# Patient Record
Sex: Male | Born: 1960 | Race: Black or African American | Hispanic: No | Marital: Single | Smoking: Former smoker
Health system: Southern US, Community
[De-identification: ages and names within clinical notes are randomized; demographics above are authoritative.]

## PROBLEM LIST (undated history)

## (undated) DIAGNOSIS — I2699 Other pulmonary embolism without acute cor pulmonale: Secondary | ICD-10-CM

## (undated) DIAGNOSIS — IMO0001 Reserved for inherently not codable concepts without codable children: Secondary | ICD-10-CM

## (undated) HISTORY — PX: BILATERAL CARPAL TUNNEL RELEASE: SHX6508

---

## 2008-02-08 ENCOUNTER — Ambulatory Visit: Payer: Self-pay | Admitting: Family Medicine

## 2008-06-24 ENCOUNTER — Ambulatory Visit: Payer: Self-pay | Admitting: Family Medicine

## 2011-03-22 ENCOUNTER — Ambulatory Visit: Payer: Self-pay | Admitting: Family Medicine

## 2011-03-23 ENCOUNTER — Ambulatory Visit: Payer: Self-pay | Admitting: Internal Medicine

## 2011-03-24 ENCOUNTER — Ambulatory Visit: Payer: Self-pay | Admitting: Internal Medicine

## 2011-03-25 ENCOUNTER — Ambulatory Visit: Payer: Self-pay | Admitting: Family Medicine

## 2011-03-27 ENCOUNTER — Ambulatory Visit: Payer: Self-pay | Admitting: Internal Medicine

## 2012-04-04 LAB — CBC
HCT: 48.2 % (ref 40.0–52.0)
HGB: 15.6 g/dL (ref 13.0–18.0)
MCH: 31.2 pg (ref 26.0–34.0)
MCHC: 32.3 g/dL (ref 32.0–36.0)
MCV: 97 fL (ref 80–100)
Platelet: 250 10*3/uL (ref 150–440)
RDW: 12.8 % (ref 11.5–14.5)
WBC: 7.6 10*3/uL (ref 3.8–10.6)

## 2012-04-04 LAB — COMPREHENSIVE METABOLIC PANEL
Albumin: 4.1 g/dL (ref 3.4–5.0)
Alkaline Phosphatase: 116 U/L (ref 50–136)
Anion Gap: 8 (ref 7–16)
BUN: 19 mg/dL — ABNORMAL HIGH (ref 7–18)
Chloride: 106 mmol/L (ref 98–107)
Co2: 25 mmol/L (ref 21–32)
EGFR (African American): >60
EGFR (Non-African Amer.): >60
Glucose: 101 mg/dL — ABNORMAL HIGH (ref 65–99)
Osmolality: 280 (ref 275–301)
SGOT(AST): 25 U/L (ref 15–37)
SGPT (ALT): 44 U/L
Sodium: 139 mmol/L (ref 136–145)
Total Protein: 8.4 g/dL — ABNORMAL HIGH (ref 6.4–8.2)

## 2012-04-04 LAB — TROPONIN I: Troponin-I: <0.02 ng/mL

## 2012-04-04 LAB — CK TOTAL AND CKMB (NOT AT ARMC): CK-MB: <0.5 ng/mL — ABNORMAL LOW (ref 0.5–3.6)

## 2012-04-05 ENCOUNTER — Inpatient Hospital Stay: Payer: Self-pay | Admitting: Internal Medicine

## 2012-04-05 LAB — PROTIME-INR
INR: 1
Prothrombin Time: 13.8 secs (ref 11.5–14.7)

## 2012-04-05 LAB — URINALYSIS, COMPLETE
Bacteria: NONE SEEN
Blood: NEGATIVE
Leukocyte Esterase: NEGATIVE
Nitrite: NEGATIVE
Ph: 5 (ref 4.5–8.0)
Protein: 100
RBC,UR: 1 /HPF (ref 0–5)
Specific Gravity: 1.048 (ref 1.003–1.030)

## 2012-04-05 LAB — DIFFERENTIAL
Basophil %: 0.6 %
Eosinophil #: 0.2 10*3/uL (ref 0.0–0.7)
Eosinophil %: 2.4 %
Lymphocyte #: 2.2 10*3/uL (ref 1.0–3.6)
Monocyte #: 0.7 x10 3/mm (ref 0.2–1.0)
Neutrophil #: 4.5 10*3/uL (ref 1.4–6.5)
Neutrophil %: 59 %

## 2012-04-06 LAB — HEMOGLOBIN: HGB: 16.3 g/dL (ref 13.0–18.0)

## 2012-04-06 LAB — PROTIME-INR
INR: 1
Prothrombin Time: 13.3 secs (ref 11.5–14.7)

## 2012-04-07 LAB — PROTIME-INR: INR: 0.9

## 2012-04-07 LAB — APTT: Activated PTT: 77.7 secs — ABNORMAL HIGH (ref 23.6–35.9)

## 2012-04-08 LAB — PROTIME-INR
INR: 0.9
Prothrombin Time: 12.8 secs (ref 11.5–14.7)

## 2012-04-08 LAB — APTT
Activated PTT: 46.2 secs — ABNORMAL HIGH (ref 23.6–35.9)
Activated PTT: 76.2 secs — ABNORMAL HIGH (ref 23.6–35.9)

## 2012-04-09 DIAGNOSIS — I517 Cardiomegaly: Secondary | ICD-10-CM

## 2012-04-09 LAB — CBC WITH DIFFERENTIAL/PLATELET
Eosinophil #: 0.2 10*3/uL (ref 0.0–0.7)
Eosinophil %: 3.3 %
Lymphocyte %: 34.5 %
MCHC: 35.5 g/dL (ref 32.0–36.0)
MCV: 96 fL (ref 80–100)
Monocyte %: 4.7 %
Neutrophil %: 56.5 %
Platelet: 279 10*3/uL (ref 150–440)
RBC: 4.88 10*6/uL (ref 4.40–5.90)
RDW: 12.9 % (ref 11.5–14.5)
WBC: 5.6 10*3/uL (ref 3.8–10.6)

## 2012-04-09 LAB — LIPID PANEL
Ldl Cholesterol, Calc: 79 mg/dL (ref 0–100)
VLDL Cholesterol, Calc: 72 mg/dL — ABNORMAL HIGH (ref 5–40)

## 2012-04-09 LAB — CK: CK, Total: 142 U/L (ref 35–232)

## 2012-04-09 LAB — HEPATIC FUNCTION PANEL A (ARMC)
Albumin: 3.3 g/dL — ABNORMAL LOW (ref 3.4–5.0)
SGOT(AST): 81 U/L — ABNORMAL HIGH (ref 15–37)
SGPT (ALT): 128 U/L — ABNORMAL HIGH
Total Protein: 8.1 g/dL (ref 6.4–8.2)

## 2012-04-09 LAB — APTT: Activated PTT: 113.3 secs — ABNORMAL HIGH (ref 23.6–35.9)

## 2012-04-09 LAB — PROTIME-INR
INR: 1.2
Prothrombin Time: 16 secs — ABNORMAL HIGH (ref 11.5–14.7)

## 2012-04-09 LAB — TSH: Thyroid Stimulating Horm: 2.66 u[IU]/mL

## 2012-04-09 LAB — CREATININE, SERUM: Creatinine: 1.14 mg/dL (ref 0.60–1.30)

## 2012-04-10 ENCOUNTER — Ambulatory Visit: Payer: Self-pay | Admitting: Internal Medicine

## 2012-04-10 LAB — HEPATIC FUNCTION PANEL A (ARMC)
Alkaline Phosphatase: 119 U/L (ref 50–136)
Bilirubin,Total: 0.3 mg/dL (ref 0.2–1.0)
SGOT(AST): 72 U/L — ABNORMAL HIGH (ref 15–37)
SGPT (ALT): 136 U/L — ABNORMAL HIGH
Total Protein: 7.7 g/dL (ref 6.4–8.2)

## 2012-04-14 ENCOUNTER — Ambulatory Visit: Payer: Self-pay | Admitting: Internal Medicine

## 2012-04-14 LAB — CEA: CEA: 0.6 ng/mL (ref 0.0–4.7)

## 2012-05-13 ENCOUNTER — Ambulatory Visit: Payer: Self-pay | Admitting: Internal Medicine

## 2012-05-15 ENCOUNTER — Ambulatory Visit: Payer: Self-pay | Admitting: Internal Medicine

## 2012-08-22 ENCOUNTER — Ambulatory Visit: Payer: Self-pay | Admitting: Internal Medicine

## 2012-09-29 ENCOUNTER — Ambulatory Visit: Payer: Self-pay | Admitting: Orthopedic Surgery

## 2012-09-29 LAB — CBC WITH DIFFERENTIAL/PLATELET
HCT: 44.8 % (ref 40.0–52.0)
HGB: 15.8 g/dL (ref 13.0–18.0)
MCH: 33.6 pg (ref 26.0–34.0)
MCHC: 35.2 g/dL (ref 32.0–36.0)
MCV: 95 fL (ref 80–100)
Monocyte #: 0.3 x10 3/mm (ref 0.2–1.0)
Monocyte %: 8.4 %
Neutrophil #: 1.7 10*3/uL (ref 1.4–6.5)
Platelet: 273 10*3/uL (ref 150–440)
RBC: 4.69 10*6/uL (ref 4.40–5.90)
RDW: 12.8 % (ref 11.5–14.5)

## 2012-09-29 LAB — BASIC METABOLIC PANEL
BUN: 14 mg/dL (ref 7–18)
Calcium, Total: 8.7 mg/dL (ref 8.5–10.1)
Co2: 25 mmol/L (ref 21–32)
Creatinine: 1.04 mg/dL (ref 0.60–1.30)
EGFR (Non-African Amer.): >60
Glucose: 89 mg/dL (ref 65–99)
Osmolality: 277 (ref 275–301)
Sodium: 139 mmol/L (ref 136–145)

## 2012-10-13 ENCOUNTER — Ambulatory Visit: Payer: Self-pay | Admitting: Orthopedic Surgery

## 2012-11-25 LAB — LIPID PANEL
Cholesterol: 199 mg/dL (ref 0–200)
HDL: 31 mg/dL — AB (ref 35–70)
LDL Cholesterol: 124 mg/dL
Triglycerides: 220 mg/dL — AB (ref 40–160)

## 2012-11-25 LAB — BASIC METABOLIC PANEL
BUN: 13 mg/dL (ref 4–21)
Creatinine: 1 mg/dL (ref <=1.3)

## 2013-02-21 ENCOUNTER — Ambulatory Visit: Payer: Self-pay | Admitting: Internal Medicine

## 2013-11-15 ENCOUNTER — Emergency Department: Payer: Self-pay | Admitting: Emergency Medicine

## 2014-02-19 ENCOUNTER — Emergency Department: Payer: Self-pay | Admitting: Emergency Medicine

## 2015-01-01 NOTE — Consult Note (Signed)
Admit Reason:   Pulmonary embolus: (415.19) Active, ICD9, Other pulmonary embolism and infarction    Present Illness CC: chest pain and dyspnea HPI: 54 y.o male with no significant PMHx presents with dyspnea and pleuritic chest pain x 2 days associated with left back pain, left arm pain, productive cough. In Ed found to be mildly hypoxic (91%); CT w contrast showed large saddle PE (rt and left). 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 Denied hemoptysis, known fam hx of bleeding/clotting disorders    No Known Allergies:   Case History and Physical Exam:   Chief Complaint Shortness of Breath    Past Surgical History None    Family History Coronary Artery Disease  Diabetes Mellitus  Cancer    HEENT PERLA    Neck/Nodes Supple  No Adenopathy    Chest/Lungs Wheeze, dyspnea,    Cardiovascular No Murmurs or Gallops    Abdomen Benign  Active bowel sounds  x 4 quadrants    Musculoskeletal Full range of motion    Neurological Grossly WNL    Skin Warm   Nursing/Ancillary Notes: **Vital Signs.:   27-Jul-13 11:25   Vital Signs Type Routine   Temperature Temperature (F) 98.2   Celsius 36.7   Temperature Source Oral   Pulse Pulse 100   Respirations Respirations 18   Systolic BP Systolic BP 794   Diastolic BP (mmHg) Diastolic BP (mmHg) 79   Mean BP 93   Pulse Ox % Pulse Ox % 94   Pulse Ox Activity Level  At rest   Oxygen Delivery Room Air/ 21 %   Telemetry pattern Cardiac Rhythm Normal sinus rhythm     Impression 1. Saddle PE: tele monitor, cont heparin drip,  dc coumadin. Start xarelto will try to get approval  2.  left leg DVT: Pt will start Xarelto 15mg  for first 21 days then transition to 20mg  for 5 more months.      Discussed with patient to start wearing Compression Stockings with work at least at the 15-36mmHG level. Patient knows to not wear them while sleeping. Recommend patient follow up as outpatient with our office in 4-6 weeks with  ultrasound and evaluation.    Plan Level 3   Electronic Signatures: Lane Hacker (PA-C)  (Signed 27-Jul-13 12:21)  Authored: Health Issues, General Aspect/Present Illness, Allergies, History and Physical Exam, Vital Signs, Impression/Plan   Last Updated: 27-Jul-13 12:21 by Lane Hacker (PA-C)

## 2015-01-01 NOTE — H&P (Signed)
PATIENT NAME:  Johnathan Madden, Johnathan Madden MR#:  [id number] DATE OF BIRTH:  1960/09/24  DATE OF ADMISSION:  04/05/2012  CHIEF COMPLAINT: Shortness of breath.   HISTORY OF PRESENT ILLNESS: The patient is a 53 year old male with no chronic medical conditions who presents with two day duration of shortness of breath associated with pleuritic chest pain. He notes that two days prior to presentation he developed shortness of breath and left pleuritic chest pain. He initially ignored it, however, on the evening of presentation pain worsened to 10 out of 10 with associated left shoulder blade pain, increased shortness of breath, and pain going down his left arm. He decided to present to the Emergency Room. He notes that he's had a dry cough with occasional production, however, denies hemoptysis. He notes that his symptoms were exacerbated by exertion. Upon evaluation in the Emergency Room, CT angiogram demonstrated saddle embolus and we were called for admission. 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 He drives approximately 8 hours daily 5 days a week.   PAST MEDICAL HISTORY: Denies.  PAST SURGICAL HISTORY: Denies.  MEDICATIONS: Aleve 2 to 4 tabs daily as needed.   ALLERGIES: Denies.   FAMILY HISTORY: Notable for deceased mother at age 38 with cancer. The patient presumes it was melanoma on her scalp. Father is alive, history of coronary artery disease and diabetes. First myocardial infarction was at age 30. There is no family history of blood clots as far as the patient is aware. However, he does note that a paternal aunt has been restricted from flying due to possibility of blood clot but he is not sure. He denies any family history of excessive bleeding. Denies history of strokes in the family.   SOCIAL HISTORY: 30-pack-year history. Drinks 1 to 2 drinks on the weekends. Denies illicit drug use. 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 He is in a monogamous relationship with a girlfriend of 12 years.   REVIEW OF SYSTEMS: CONSTITUTIONAL: Fatigue. He snores. He has reported witnessed apneic episodes per his live-in partner. Also admits to daytime somnolence and naps during the day when he is able to on the weekends. EYES: Blurred vision x6 months. ENT: Admits of snoring. Denies tinnitus, epistaxis. RESPIRATORY: Admits of cough, wheeze, and dyspnea. Denies hemoptysis. CARDIOVASCULAR: Admits to chest pain. Denies orthopnea, edema, palpitations. GI: Denies nausea, vomiting, diarrhea, abdominal pain, hematemesis, melena, or rectal bleeding. GU: Denies dysuria, hematuria. ENDOCRINE: Denies increased sweating. HEME: Denies easy bruising or bleeding. INTEGUMENTARY: Denies any new skin lesions. MUSCULOSKELETAL: Admits to left shoulder pain for two days. Also admits to lower back pain secondary to degenerative disk disease. NEUROLOGIC: Admits to occasional intermittent tingling down his right leg for a year. PSYCH: No changes. 10 point review of systems was performed and was otherwise negative as stated above.  PHYSICAL EXAMINATION:  VITAL SIGNS: Blood pressure 110/74, respirations 24, heart rate ranging 85 to 111, oxygen sat 91% on room air, increased to 98% on 2 liters nasal cannula.   GENERAL: Well appearing obese male in no apparent distress. Awake, alert, and oriented x3. Speaking in full sentences.  HEENT: Normocephalic and atraumatic. Extraocular muscles intact. Pinpoint pupils. Anicteric sclerae. Bilateral conjunctival injection. Normal external ears and nares. Posterior oropharynx crowding. No oral lesions. Moist mucous membranes.   NECK: Short neck. No lymphadenopathy appreciated. No JVD.   CARDIOVASCULAR: Normal S1, S2, regular rate and rhythm. No murmurs.  LUNGS: Clear to auscultation bilaterally. No wheezes, rales, or rhonchi.  ABDOMEN: Rotunded, soft, nontender, nondistended with normal bowel  sounds.   EXTREMITIES: No clubbing, cyanosis, or edema.   NEUROLOGIC: Full strength bilateral upper and lower extremities. Nonfocal exam.   LABORATORY DATA: CBC shows WBC 7.6, hemoglobin 15.6, hematocrit 48.2, platelets 250 with an MCV of 97. Troponin is less than 0.02. Total CK 281. CPK-MB less than 0.5. BMP shows glucose 101, BUN 19, creatinine 1.02, sodium 139, potassium 3.7, chloride 106, bicarb 25, calcium 8.9. LFTs show total bilirubin 1.3, alkaline phosphatase 116, ALT 44, AST 25, total protein 8.4, albumin 4.1, osmolality 280, anion gap 8. PTT 28.5.   EKG: Sinus tachycardia at 107 bpm with poor R wave progression  CXR: Preliminary read showed increased interstial markings bilaterally and small left pleural effusion.  CT of the chest with contrast shows saddle embolus extending across the main pulmonary artery and into the right pulmonary artery and right lower lobe pulmonary artery branches. The saddle embolus extends into the left main pulmonary artery. Minimal thrombus within the proximal left upper lobe pulmonary artery branch. Nonocclusive thrombus within branches of the left lower lobe pulmonary arteries also noted. Mild mediastinal lymphadenopathy. Small left pleural effusion. Mild scattered atelectasis within the lungs. Mild degenerative changes at the spine.   ASSESSMENT AND PLAN: This is a 54 year old male presenting with pleuritic chest pain, cough, dyspnea, found to have large saddle embolus. 1. Large pulmonary embolus without hemodynamic compromise. The patient's risk factors for thromboembolic disease include obesity and his profession as a courier. Family history does not suggest any predisposing genetic or hereditary illnesses. At this time he's been started on heparin drip. Will start him on Coumadin. Will plan to maintain the heparin drip until his INR is therapeutic with a goal INR between 2 to 3 for two readings 24 hours apart. Workup for any underlying thrombotic illnesses  will be deferred at this time given that the patient has provoking factor as well as the fact that he's on anti-coagulation. The patient was educated about Coumadin and the need for routine monitoring and he agreed with this plan. He plans on obtaining a primary care provider.  2. Fatigue due to somnolence. Given the patient's obesity, his BMI is 35.6, concern that he may have underlying obstructive sleep apnea versus obesity hyperventilation syndrome which may be leading to his symptoms. The patient would benefit from outpatient sleep study to further assess this. This was discussed with the patient and he is agreeable to this workup as well as an outpatient. 3.  Tobacco abuse: Nicotine patch 4. Prophylaxis. The patient is on full dose anti-coagulation on heparin drip.  5. Disposition. The patient is being admitted to the Internal Medicine hospitalist service for ongoing management and care.   Thank you for involving [place] in the care of this patient.   ____________________________ Samson Frederic, DO aeo:drc D: 04/05/2012 04:26:15 ET T: 04/05/2012 07:46:30 ET JOB#: [id number] cc: Samson Frederic, DO, <Dictator> Gerell Fortson E Carman Essick DO ELECTRONICALLY SIGNED 04/05/2012 8:50

## 2015-01-01 NOTE — Consult Note (Signed)
Chief Complaint:   Subjective/Chief Complaint doing better INR still not therapeutic   VITAL SIGNS/ANCILLARY NOTES: **Vital Signs.:   26-Jul-13 08:38   Vital Signs Type Routine   Temperature Temperature (F) 97.8   Celsius 36.5   Temperature Source oral   Pulse Pulse 77   Respirations Respirations 20   Systolic BP Systolic BP 621   Diastolic BP (mmHg) Diastolic BP (mmHg) 79   Mean BP 93   Pulse Ox % Pulse Ox % 97   Pulse Ox Activity Level  At rest   Oxygen Delivery Room Air/ 21 %  *Intake and Output.:   Shift 26-Jul-13 15:00   Grand Totals Intake:  360 Output:      Net:  360 24 Hr.:  360   Oral Intake      In:  360   Length of Stay Totals Intake:  4596 Output:  4200    Net:  396   Brief Assessment:   Cardiac Regular  -- LE edema    Respiratory normal resp effort  clear BS  no use of accessory muscles    Gastrointestinal details normal Soft  Bowel sounds normal  No rigidity   Lab Results: Routine Coag:  26-Jul-13 04:17    Activated PTT (APTT)  46.2 (A HCT value >55% may artifactually increase the APTT. In one study, the increase was an average of 19%. Reference: "Effect on Routine and Special Coagulation Testing Values of Citrate Anticoagulant Adjustment in Patients with High HCT Values." American Journal of Clinical Pathology 2006;126:400-405.)   Prothrombin 12.8   INR 0.9 (INR reference interval applies to patients on anticoagulant therapy. A single INR therapeutic range for coumarins is not optimal for all indications; however, the suggested range for most indications is 2.0 - 3.0. Exceptions to the INR Reference Range may include: Prosthetic heart valves, acute myocardial infarction, prevention of myocardial infarction, and combinations of aspirin and anticoagulant. The need for a higher or lower target INR must be assessed individually. Reference: The Pharmacology and Management of the Vitamin K  antagonists: the seventh ACCP Conference on Antithrombotic  and Thrombolytic Therapy. HYQMV.7846 Sept:126 (3suppl): [id number]. A HCT value >55% may artifactually increase the PT.  In one study,  the increase was an average of 25%. Reference:  "Effect on Routine and Special Coagulation Testing Values of Citrate Anticoagulant Adjustment in Patients with High HCT Values." American Journal of Clinical Pathology 2006;126:400-405.)   Assessment/Plan:  Assessment/Plan:   Assessment 1. PE -continue with anticoagulation -on coumadin -discussed smoking cessation also with the patient at length   Electronic Signatures: Allyne Gee (MD)  (Signed 26-Jul-13 12:21)  Authored: Chief Complaint, VITAL SIGNS/ANCILLARY NOTES, Brief Assessment, Lab Results, Assessment/Plan   Last Updated: 26-Jul-13 12:21 by Allyne Gee (MD)

## 2015-01-01 NOTE — Consult Note (Signed)
PATIENT NAME:  Johnathan Madden, Johnathan Madden MR#:  [id number] DATE OF BIRTH:  05-24-1961  DATE OF CONSULTATION:  04/05/2012  REFERRING PHYSICIAN:   CONSULTING PHYSICIAN:  Allyne Gee, MD  REASON FOR CONSULTATION: Pulmonary embolism.   HISTORY OF PRESENT ILLNESS: 54 year old African American gentleman who came into the hospital because of increasing shortness of breath. Apparently, he says that about 2 to 3 weeks ago he had some pain in his calf. The patient really did not feel too much about that. Subsequently a few days ago he started having increasing shortness of breath. He also noted some pleuritic type of pain in his chest. The patient continued to get worse and the shortness of breath continued to get worse and so he decided to come into the hospital. He is not having any syncopal episodes. No hemoptysis was noted. The patient had a CT angiogram in the ER and it showed a saddle embolus. 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   SOCIAL HISTORY: He does smoke and he has an occasional drink on the weekends. No other drug use is noted.   PAST MEDICAL HISTORY: Unremarkable.   PAST SURGICAL HISTORY: Unremarkable. No recent trauma.   ALLERGIES: Negative.   FAMILY HISTORY: Positive for malignancy, coronary artery disease, and diabetes.   REVIEW OF SYSTEMS: Complete 12-point review of systems was performed and was unremarkable other than what is noted above in the history of present illness.   PHYSICAL EXAMINATION:  VITAL SIGNS: Temperature 98, pulse 82, respiratory rate 18, blood pressure 106/63, saturations 95% on 2 liters flow.   NECK: Appeared to be supple. There was no JVD. No adenopathy. No thyromegaly.   CHEST: Good breath sounds bilaterally. No rhonchi, no rales. Expansion was equal.   CARDIOVASCULAR: S1, S2 is normal. Regular rhythm. No gallop or rub.   ABDOMEN: Soft. Bowel sounds present. No rebound or rigidity.    NEUROLOGIC: The patient was unchanged.   ABDOMEN: Soft and nontender.   LABORATORY DATA: The CT findings showed a saddle embolus in the right and left main pulmonary arteries. It also showed some thrombi in the left upper lobe, nonocclusive thrombi in the branches of the left lower lobe. The patient's chemistries show BUN of 19, creatinine of 1, glucose 101. CPK 281. Sodium 139, potassium 3.7. White count was 7.6, hemoglobin 15.6, hematocrit 48.2.   IMPRESSION:  Acute pulmonary thromboembolism. Predisposing factor probably prolonged immobility from driving.   PLAN: At this time I would suggest continuing with heparin.  He should be started on Coumadin. Discussed with him the risks and benefits of anticoagulation therapy. The patient does understand. The patient also should have a lower extremity Doppler to look for remaining emboli. Right now he does not show any hemodynamic compromise so thrombolytics are not indicated. Continue with supportive care and monitor. The patient's prognosis remains guarded at this time.   ____________________________ Allyne Gee, MD sak:bjt D: 04/05/2012 12:09:30 ET T: 04/05/2012 13:03:32 ET JOB#: [id number]  cc: Allyne Gee, MD, <Dictator> Allyne Gee MD ELECTRONICALLY SIGNED 04/09/2012 13:32
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
## 2015-01-04 NOTE — Op Note (Signed)
PATIENT NAME:  Johnathan Madden, Johnathan Madden MR#:  [id number] DATE OF BIRTH:  [birth year]  DATE OF PROCEDURE:  10/13/2012  PREOPERATIVE DIAGNOSIS: Bilateral carpal tunnel syndrome.   POSTOPERATIVE DIAGNOSIS: Bilateral carpal tunnel syndrome.   PROCEDURE: Bilateral carpal tunnel release.   SURGEON: Laurene Footman, M.D.   ANESTHESIA: General.    DESCRIPTION OF PROCEDURE: The patient was brought to the operating room and after an adequate general anesthesia was obtained, both arms were prepped and draped in the usual sterile fashion with a tourniquet applied to the upper arm. The right arm was addressed first. After patient identification and timeout procedures were completed and after having prepped and draped the area of the planned incision was infiltrated 10 mL of 0.5% Sensorcaine with epinephrine. The tourniquet was raised to 250 mmHg and an incision was made in line with the ring metacarpal along the palmar crease, approximately 2.5 cm in length. The skin and subcutaneous tissue were divided with hemostasis being achieved with electrocautery. The transverse carpal ligament was identified and a small nick made. A fascia hemostat was placed underneath this to protect the underlying structure. Release was carried out distally until there was fat noted consistent with lack of compression under the current transverse carpal ligament. Going proximally, release was carried out in a similar fashion. There was an approximately 1 cm area of slight hourglass constriction consistent with compression. After release, there was good vascular blush to this area and more proximally, there did not appear to be any further compression. The wound was then closed with simple interrupted 5-0 nylon skin sutures, a compressive dressing of Xeroform, 4 x 4's, Webril, and Ace wrap. Tourniquet time was 12 minutes at 250 mmHg.  The left hand was addressed in a similar fashion with similar findings. Time out procedure was carried out. Local  anesthetic again infiltrated into the area of the incision. The tourniquet raised to 250 mmHg. Approximately 2 to 2.5 cm skin incision made in line with the ring metacarpal. Hemostasis being achieved with electrocautery. The transverse carpal ligament was incised and again, there was approximately a 1 cm area of  pallor to the nerve with obvious constriction in the midportion of the carpal tunnel. After release, the wound was again closed with simple interrupted 5-0 nylon. There was mild tenosynovitis. There were no masses within the carpal tunnel.   SPECIMEN: None.   ESTIMATED BLOOD LOSS: Minimal.   TOURNIQUET TIME ON THE LEFT WRIST: 11 minutes.   COMPLICATIONS: None.   CONDITION: To recovery room stable.   ____________________________ Laurene Footman, MD [email or web address] D: 10/13/2012 08:58:05 ET T: 10/13/2012 10:29:07 ET JOB#: [id number]  cc: Laurene Footman, MD, <Dictator> Laurene Footman MD ELECTRONICALLY SIGNED 10/13/2012 15:02

## 2015-07-16 ENCOUNTER — Encounter: Payer: Self-pay | Admitting: Internal Medicine

## 2015-07-16 DIAGNOSIS — F17200 Nicotine dependence, unspecified, uncomplicated: Secondary | ICD-10-CM | POA: Insufficient documentation

## 2015-07-16 DIAGNOSIS — E291 Testicular hypofunction: Secondary | ICD-10-CM | POA: Insufficient documentation

## 2015-07-16 DIAGNOSIS — E785 Hyperlipidemia, unspecified: Secondary | ICD-10-CM | POA: Insufficient documentation

## 2015-07-16 DIAGNOSIS — G5600 Carpal tunnel syndrome, unspecified upper limb: Secondary | ICD-10-CM | POA: Insufficient documentation

## 2015-07-16 DIAGNOSIS — K760 Fatty (change of) liver, not elsewhere classified: Secondary | ICD-10-CM | POA: Insufficient documentation

## 2015-08-22 ENCOUNTER — Encounter: Payer: Self-pay | Admitting: Internal Medicine

## 2015-08-22 ENCOUNTER — Ambulatory Visit (INDEPENDENT_AMBULATORY_CARE_PROVIDER_SITE_OTHER): Payer: Self-pay | Admitting: Internal Medicine

## 2015-08-22 VITALS — BP 100/64 | HR 88 | Ht 70.0 in | Wt 264.0 lb

## 2015-08-22 DIAGNOSIS — R9389 Abnormal findings on diagnostic imaging of other specified body structures: Secondary | ICD-10-CM

## 2015-08-22 DIAGNOSIS — H9193 Unspecified hearing loss, bilateral: Secondary | ICD-10-CM

## 2015-08-22 DIAGNOSIS — R938 Abnormal findings on diagnostic imaging of other specified body structures: Secondary | ICD-10-CM

## 2015-08-22 DIAGNOSIS — F17200 Nicotine dependence, unspecified, uncomplicated: Secondary | ICD-10-CM
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
## 2015-08-23 ENCOUNTER — Encounter: Payer: Self-pay | Admitting: Pulmonary Disease

## 2015-08-23 ENCOUNTER — Ambulatory Visit (INDEPENDENT_AMBULATORY_CARE_PROVIDER_SITE_OTHER): Payer: Managed Care, Other (non HMO) | Admitting: Pulmonary Disease

## 2015-08-23 VITALS — BP 132/76 | HR 78 | Ht 70.0 in | Wt 264.4 lb

## 2015-08-23 DIAGNOSIS — F17200 Nicotine dependence, unspecified, uncomplicated: Secondary | ICD-10-CM

## 2015-08-23 DIAGNOSIS — R918 Other nonspecific abnormal finding of lung field: Secondary | ICD-10-CM | POA: Diagnosis not present

## 2015-08-23 DIAGNOSIS — J438 Other emphysema: Secondary | ICD-10-CM | POA: Diagnosis not present

## 2015-08-23 DIAGNOSIS — Z720 Tobacco use: Secondary | ICD-10-CM
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
## 2015-08-30 ENCOUNTER — Ambulatory Visit: Payer: Self-pay | Admitting: Internal Medicine

## 2015-08-30 ENCOUNTER — Ambulatory Visit
Admission: RE | Admit: 2015-08-30 | Discharge: 2015-08-30 | Disposition: A | Discharge status: Home / Self Care | Payer: Managed Care, Other (non HMO) | Source: Ambulatory Visit | Attending: Pulmonary Disease | Admitting: Pulmonary Disease

## 2015-08-30 ENCOUNTER — Encounter: Payer: Self-pay | Admitting: *Deleted

## 2015-08-30 ENCOUNTER — Encounter
Admission: RE | Disposition: A | Discharge status: Home / Self Care | Payer: Self-pay | Source: Ambulatory Visit | Attending: Pulmonary Disease

## 2015-08-30 DIAGNOSIS — R0602 Shortness of breath: Secondary | ICD-10-CM | POA: Diagnosis not present

## 2015-08-30 DIAGNOSIS — Z539 Procedure and treatment not carried out, unspecified reason: Secondary | ICD-10-CM | POA: Insufficient documentation

## 2015-08-30 DIAGNOSIS — F17200 Nicotine dependence, unspecified, uncomplicated: Secondary | ICD-10-CM | POA: Insufficient documentation

## 2015-08-30 DIAGNOSIS — R918 Other nonspecific abnormal finding of lung field: Secondary | ICD-10-CM | POA: Diagnosis not present

## 2015-08-30 DIAGNOSIS — Z8249 Family history of ischemic heart disease and other diseases of the circulatory system: Secondary | ICD-10-CM | POA: Diagnosis not present

## 2015-08-30 DIAGNOSIS — R222 Localized swelling, mass and lump, trunk: Secondary | ICD-10-CM

## 2015-08-30 DIAGNOSIS — Z86711 Personal history of pulmonary embolism: Secondary | ICD-10-CM | POA: Diagnosis not present

## 2015-08-30 DIAGNOSIS — Z808 Family history of malignant neoplasm of other organs or systems: Secondary | ICD-10-CM | POA: Insufficient documentation

## 2015-08-30 HISTORY — PX: FLEXIBLE BRONCHOSCOPY: SHX5094

## 2015-08-30 HISTORY — DX: Other pulmonary embolism without acute cor pulmonale: I26.99

## 2015-08-30 SURGERY — BRONCHOSCOPY, FLEXIBLE
Anesthesia: Moderate Sedation | Laterality: Right

## 2015-08-30 MED ORDER — FENTANYL CITRATE (PF) 250 MCG/5ML IJ SOLN
INTRAMUSCULAR | Status: AC
  Filled 2015-08-30: qty 5

## 2015-08-30 MED ORDER — MIDAZOLAM HCL 5 MG/5ML IJ SOLN
INTRAMUSCULAR | Status: AC
  Filled 2015-08-30: qty 10

## 2015-08-30 MED ORDER — MIDAZOLAM HCL 2 MG/2ML IJ SOLN
INTRAMUSCULAR | Status: AC | PRN
  Administered 2015-08-30: 2 mg via INTRAVENOUS

## 2015-08-30 MED ORDER — MIDAZOLAM HCL 5 MG/5ML IJ SOLN
INTRAMUSCULAR | Status: AC | PRN
  Administered 2015-08-30: 4 mg via INTRAVENOUS

## 2015-08-30 MED ORDER — FENTANYL CITRATE (PF) 100 MCG/2ML IJ SOLN
INTRAMUSCULAR | Status: AC | PRN
  Administered 2015-08-30: 25 ug via INTRAVENOUS
  Administered 2015-08-30: 50 ug via INTRAVENOUS

## 2015-08-30 NOTE — Sedation Documentation (Signed)
Bronchoscopy procedure aborted by MD and probe removed. Pt restless and trying to pull out probe, felt that patient need to have procedure done under anesthesia. Pt transferred to [facility] at 1505 and report to K. Millspaugh,RN given

## 2015-08-30 NOTE — Procedures (Signed)
Indication:   RUL endobronchial mass  Premedication:  midaz 4 mg Fent 50 mcg fent 25 mcg midaz 2 mg   Anesthesia: Cetacaine to throat Viscous lidocaine to B nares 40 cc 1% lidocaine  Procedure: After adequate sedation and anesthesia, the bronchoscope could not be passed through either nares and was therefore introduced through mouth and advanced into the posterior pharynx. Further 1% lidocaine was administered and the scope was advanced into the trachea. At this point, the patient became very combative and received more sedation medications and 1% lidocaine. However, he could never be made adequately comfortable. The trachea and primary airways were visualized and the scope was briefly advanced into the RUL bronchus where an obstructing lesion was noted in the anterior segment of the RUL. The procedure was aborted at this time. No specimens were obtained   PLAN: Will repeat next week bronchoscopy with ETT and anesthesia  Merton Border, MD;  PCCM service; Mobile [phone]

## 2015-08-30 NOTE — Discharge Instructions (Signed)
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
## 2015-09-01 ENCOUNTER — Encounter: Payer: Self-pay | Admitting: Pulmonary Disease

## 2015-09-03 ENCOUNTER — Ambulatory Visit: Payer: Self-pay | Admitting: Internal Medicine

## 2015-09-03 ENCOUNTER — Encounter
Admission: RE | Admit: 2015-09-03 | Discharge: 2015-09-03 | Disposition: A | Discharge status: Home / Self Care | Payer: Managed Care, Other (non HMO) | Source: Ambulatory Visit | Attending: Pulmonary Disease | Admitting: Pulmonary Disease

## 2015-09-03 DIAGNOSIS — Z808 Family history of malignant neoplasm of other organs or systems: Secondary | ICD-10-CM | POA: Diagnosis not present

## 2015-09-03 DIAGNOSIS — D1431 Benign neoplasm of right bronchus and lung: Secondary | ICD-10-CM | POA: Diagnosis not present

## 2015-09-03 DIAGNOSIS — R0602 Shortness of breath: Secondary | ICD-10-CM | POA: Diagnosis not present

## 2015-09-03 DIAGNOSIS — E669 Obesity, unspecified: Secondary | ICD-10-CM | POA: Diagnosis not present

## 2015-09-03 DIAGNOSIS — F17210 Nicotine dependence, cigarettes, uncomplicated: Secondary | ICD-10-CM | POA: Diagnosis not present

## 2015-09-03 DIAGNOSIS — Z7982 Long term (current) use of aspirin: Secondary | ICD-10-CM | POA: Diagnosis not present

## 2015-09-03 DIAGNOSIS — Z8249 Family history of ischemic heart disease and other diseases of the circulatory system: Secondary | ICD-10-CM | POA: Diagnosis not present

## 2015-09-03 DIAGNOSIS — R918 Other nonspecific abnormal finding of lung field: Secondary | ICD-10-CM | POA: Diagnosis present

## 2015-09-03 HISTORY — DX: Reserved for inherently not codable concepts without codable children: IMO0001

## 2015-09-03 LAB — DIFFERENTIAL
Basophils Absolute: 0.1 10*3/uL (ref 0–0.1)
Basophils Relative: 1 %
EOS ABS: 0.2 10*3/uL (ref 0–0.7)
EOS PCT: 4 %
LYMPHS ABS: 1.3 10*3/uL (ref 1.0–3.6)
Lymphocytes Relative: 26 %
MONO ABS: 0.4 10*3/uL (ref 0.2–1.0)
MONOS PCT: 8 %
NEUTROS PCT: 61 %
Neutro Abs: 3 10*3/uL (ref 1.4–6.5)

## 2015-09-03 LAB — CBC
HCT: 45.5 % (ref 40.0–52.0)
HEMOGLOBIN: 15.5 g/dL (ref 13.0–18.0)
MCH: 32.4 pg (ref 26.0–34.0)
MCHC: 34.1 g/dL (ref 32.0–36.0)
MCV: 95.1 fL (ref 80.0–100.0)
Platelets: 224 10*3/uL (ref 150–440)
RBC: 4.78 MIL/uL (ref 4.40–5.90)
RDW: 12.5 % (ref 11.5–14.5)
WBC: 4.9 10*3/uL (ref 3.8–10.6)

## 2015-09-03 NOTE — Patient Instructions (Signed)
  Your procedure is scheduled [id number] 23, 2016 (Friday) Report to Same Day Surgery, Medical Mall, Second Floor To find out your arrival time please call [phone] between 1PM - 3PM on September 05, 2015 (Thursday)  Remember: Instructions that are not followed completely may result in serious medical risk, up to and including death, or upon the discretion of your surgeon and anesthesiologist your surgery may need to be rescheduled.    __x__ 1. Do not eat food or drink liquids after midnight. No gum chewing or hard candies.     ____ 2. No Alcohol for 24 hours before or after surgery.   ____ 3. Bring all medications with you on the day of surgery if instructed.    __x__ 4. Notify your doctor if there is any change in your medical condition     (cold, fever, infections).     Do not wear jewelry, make-up, hairpins, clips or nail polish.  Do not wear lotions, powders, or perfumes. You may wear deodorant.  Do not shave 48 hours prior to surgery. Men may shave face and neck.  Do not bring valuables to the hospital.    [facility] is not responsible for any belongings or valuables.               Contacts, dentures or bridgework may not be worn into surgery.  Leave your suitcase in the car. After surgery it may be brought to your room.  For patients admitted to the hospital, discharge time is determined by your                treatment team.   Patients discharged the day of surgery will not be allowed to drive home.   Please read over the following fact sheets that you were given:   Surgical Site Infection Prevention   ____ Take these medicines the morning of surgery with A SIP OF WATER:    1.   2.   3.   4.  5.  6.  ____ Fleet Enema (as directed)   ____ Use CHG Soap as directed  ____ Use inhalers on the day of surgery  ____ Stop metformin 2 days prior to surgery    ____ Take 1/2 of usual insulin dose the night before surgery and none on the morning of surgery.   __x__  Stop Coumadin/Plavix/aspirin on (Patient has stopped Aspirin)  __x__ Stop Anti-inflammatories on (Stop Aleve now, Tylenol ok to take for pain in needed)   ____ Stop supplements until after surgery.    ____ Bring C-Pap to the hospital.

## 2015-09-03 NOTE — Pre-Procedure Instructions (Signed)
Cleared low risk by Dr Army Melia

## 2015-09-03 NOTE — Pre-Procedure Instructions (Signed)
As instructed by Dr Amie Critchley, ekg called and faxed to pcp Dr Army Melia. Spoke with Maudie Mercury and left message for [employer].Also called and faxed to Dr Alva Garnet and spoke with Baxter Flattery. Notified patient

## 2015-09-06 ENCOUNTER — Encounter
Admission: RE | Disposition: A | Discharge status: Home / Self Care | Payer: Self-pay | Source: Ambulatory Visit | Attending: Pulmonary Disease

## 2015-09-06 ENCOUNTER — Encounter: Payer: Self-pay | Admitting: *Deleted

## 2015-09-06 ENCOUNTER — Ambulatory Visit
Admission: RE | Admit: 2015-09-06 | Discharge: 2015-09-06 | Disposition: A | Discharge status: Home / Self Care | Payer: Managed Care, Other (non HMO) | Source: Ambulatory Visit | Attending: Pulmonary Disease | Admitting: Pulmonary Disease

## 2015-09-06 ENCOUNTER — Ambulatory Visit: Payer: Managed Care, Other (non HMO) | Admitting: Anesthesiology

## 2015-09-06 ENCOUNTER — Ambulatory Visit: Payer: Self-pay | Admitting: Internal Medicine

## 2015-09-06 DIAGNOSIS — Z7982 Long term (current) use of aspirin: Secondary | ICD-10-CM | POA: Insufficient documentation

## 2015-09-06 DIAGNOSIS — F17210 Nicotine dependence, cigarettes, uncomplicated: Secondary | ICD-10-CM | POA: Insufficient documentation

## 2015-09-06 DIAGNOSIS — Z8249 Family history of ischemic heart disease and other diseases of the circulatory system: Secondary | ICD-10-CM | POA: Insufficient documentation

## 2015-09-06 DIAGNOSIS — R0602 Shortness of breath: Secondary | ICD-10-CM | POA: Insufficient documentation

## 2015-09-06 DIAGNOSIS — Z808 Family history of malignant neoplasm of other organs or systems: Secondary | ICD-10-CM | POA: Insufficient documentation

## 2015-09-06 DIAGNOSIS — D1431 Benign neoplasm of right bronchus and lung: Secondary | ICD-10-CM | POA: Diagnosis not present

## 2015-09-06 DIAGNOSIS — E669 Obesity, unspecified: Secondary | ICD-10-CM | POA: Insufficient documentation

## 2015-09-06 DIAGNOSIS — R918 Other nonspecific abnormal finding of lung field: Secondary | ICD-10-CM

## 2015-09-06 HISTORY — PX: FLEXIBLE BRONCHOSCOPY: SHX5094

## 2015-09-06 SURGERY — BRONCHOSCOPY, FLEXIBLE
Anesthesia: General

## 2015-09-06 MED ORDER — ONDANSETRON HCL 4 MG/2ML IJ SOLN
4.0000 mg | Freq: Once | INTRAMUSCULAR | Status: AC | PRN
  Administered 2015-09-06: 4 mg via INTRAVENOUS

## 2015-09-06 MED ORDER — PHENYLEPHRINE HCL 10 MG/ML IJ SOLN
INTRAMUSCULAR | Status: DC | PRN
  Administered 2015-09-06 (×3): 100 ug via INTRAVENOUS

## 2015-09-06 MED ORDER — FENTANYL CITRATE (PF) 100 MCG/2ML IJ SOLN
INTRAMUSCULAR | Status: DC | PRN
  Administered 2015-09-06: 100 ug via INTRAVENOUS

## 2015-09-06 MED ORDER — LACTATED RINGERS IV SOLN
INTRAVENOUS | Status: DC
  Administered 2015-09-06: 12:00:00 via INTRAVENOUS

## 2015-09-06 MED ORDER — PROPOFOL 10 MG/ML IV BOLUS
INTRAVENOUS | Status: DC | PRN
Start: 2015-09-06 — End: 2015-09-06
  Administered 2015-09-06: 150 mg via INTRAVENOUS

## 2015-09-06 MED ORDER — LIDOCAINE HCL (CARDIAC) 20 MG/ML IV SOLN
INTRAVENOUS | Status: DC | PRN
  Administered 2015-09-06: 100 mg via INTRAVENOUS

## 2015-09-06 MED ORDER — FAMOTIDINE 20 MG PO TABS
20.0000 mg | ORAL_TABLET | Freq: Once | ORAL | Status: AC
  Administered 2015-09-06: 20 mg via ORAL

## 2015-09-06 MED ORDER — FENTANYL CITRATE (PF) 100 MCG/2ML IJ SOLN: 25.0000 ug | INTRAMUSCULAR | Status: DC | PRN

## 2015-09-06 MED ORDER — DEXAMETHASONE SODIUM PHOSPHATE 10 MG/ML IJ SOLN
INTRAMUSCULAR | Status: DC | PRN
  Administered 2015-09-06: 10 mg via INTRAVENOUS

## 2015-09-06 MED ORDER — PHENYLEPHRINE HCL 0.25 % NA SOLN
1.0000 | Freq: Four times a day (QID) | NASAL | Status: DC | PRN
  Filled 2015-09-06: qty 15

## 2015-09-06 MED ORDER — PROPOFOL 500 MG/50ML IV EMUL
INTRAVENOUS | Status: DC | PRN
  Administered 2015-09-06: 100 ug/kg/min via INTRAVENOUS

## 2015-09-06 MED ORDER — BUTAMBEN-TETRACAINE-BENZOCAINE 2-2-14 % EX AERO
1.0000 | INHALATION_SPRAY | Freq: Once | CUTANEOUS | Status: DC
  Filled 2015-09-06: qty 20

## 2015-09-06 MED ORDER — FAMOTIDINE 20 MG PO TABS
ORAL_TABLET | ORAL | Status: AC
Start: 2015-09-06 — End: 2015-09-06
  Administered 2015-09-06: 20 mg via ORAL
  Filled 2015-09-06: qty 1

## 2015-09-06 MED ORDER — SUCCINYLCHOLINE CHLORIDE 20 MG/ML IJ SOLN
INTRAMUSCULAR | Status: DC | PRN
  Administered 2015-09-06: 100 mg via INTRAVENOUS

## 2015-09-06 MED ORDER — LIDOCAINE HCL 2 % EX GEL: 1.0000 "application " | Freq: Once | CUTANEOUS | Status: DC

## 2015-09-06 NOTE — Discharge Instructions (Signed)
Resume low dose aspirin tomorrow, September 07, 2015  Dr. Alva Garnet office will call  You for a follow upGeneral Anesthesia, Adult General anesthesia is a sleep-like state of non-feeling produced by medicines (anesthetics). General anesthesia prevents you from being alert and feeling pain during a medical procedure. Your caregiver may recommend general anesthesia if your procedure:  Is long.  Is painful or uncomfortable.  Would be frightening to see or hear.  Requires you to be still.  Affects your breathing.  Causes significant blood loss. LET YOUR CAREGIVER KNOW ABOUT:  Allergies to food or medicine.  Medicines taken, including vitamins, herbs, eyedrops, over-the-counter medicines, and creams.  Use of steroids (by mouth or creams).  Previous problems with anesthetics or numbing medicines, including problems experienced by relatives.  History of bleeding problems or blood clots.  Previous surgeries and types of anesthetics received.  Possibility of pregnancy, if this applies.  Use of cigarettes, alcohol, or illegal drugs.  Any health condition(s), especially diabetes, sleep apnea, and high blood pressure. RISKS AND COMPLICATIONS General anesthesia rarely causes complications. However, if complications do occur, they can be life threatening. Complications include:  A lung infection.  A stroke.  A heart attack.  Waking up during the procedure. When this occurs, the patient may be unable to move and communicate that he or she is awake. The patient may feel severe pain. Older adults and adults with serious medical problems are more likely to have complications than adults who are young and healthy. Some complications can be prevented by answering all of your caregiver's questions thoroughly and by following all pre-procedure instructions. It is important to tell your caregiver if any of the pre-procedure instructions, especially those related to diet, were not followed. Any  food or liquid in the stomach can cause problems when you are under general anesthesia. BEFORE THE PROCEDURE  Ask your caregiver if you will have to spend the night at the hospital. If you will not have to spend the night, arrange to have an adult drive you and stay with you for 24 hours.  Follow your caregiver's instructions if you are taking dietary supplements or medicines. Your caregiver may tell you to stop taking them or to reduce your dosage.  Do not smoke for as long as possible before your procedure. If possible, stop smoking 3-6 weeks before the procedure.  Do not take new dietary supplements or medicines within 1 week of your procedure unless your caregiver approves them.  Do not eat within 8 hours of your procedure or as directed by your caregiver. Drink only clear liquids, such as water, black coffee (without milk or cream), and fruit juices (without pulp).  Do not drink within 3 hours of your procedure or as directed by your caregiver.  You may brush your teeth on the morning of the procedure, but make sure to spit out the toothpaste and water when finished. PROCEDURE  You will receive anesthetics through a mask, through an intravenous (IV) access tube, or through both. A doctor who specializes in anesthesia (anesthesiologist) or a nurse who specializes in anesthesia (nurse anesthetist) or both will stay with you throughout the procedure to make sure you remain unconscious. He or she will also watch your blood pressure, pulse, and oxygen levels to make sure that the anesthetics do not cause any problems. Once you are asleep, a breathing tube or mask may be used to help you breathe. AFTER THE PROCEDURE You will wake up after the procedure is complete. You may be  in the room where the procedure was performed or in a recovery area. You may have a sore throat if a breathing tube was used. You may also feel:  Dizzy.  Weak.  Drowsy.  Confused.  Nauseous.  Cold. These are all  normal responses and can be expected to last for up to 24 hours after the procedure is complete. A caregiver will tell you when you are ready to go home. This will usually be when you are fully awake and in stable condition.   This information is not intended to replace advice given to you by your health care provider. Make sure you discuss any questions you have with your health care provider.   Document Released: 12/08/2007 Document Revised: 09/21/2014 Document Reviewed: 12/30/2011 Elsevier Interactive Patient Education [employer].

## 2015-09-06 NOTE — Anesthesia Procedure Notes (Signed)
Procedure Name: Intubation Date/Time: 09/06/2015 12:15 PM Performed by: Dionne Bucy Pre-anesthesia Checklist: Patient identified, Patient being monitored, Timeout performed, Emergency Drugs available and Suction available Patient Re-evaluated:Patient Re-evaluated prior to inductionOxygen Delivery Method: Circle system utilized Preoxygenation: Pre-oxygenation with 100% oxygen Intubation Type: IV induction Ventilation: Mask ventilation without difficulty Laryngoscope Size: McGraph and 4 Grade View: Grade I Tube type: Oral Tube size: 8.5 mm Number of attempts: 1 Airway Equipment and Method: Stylet Placement Confirmation: ETT inserted through vocal cords under direct vision,  positive ETCO2 and breath sounds checked- equal and bilateral Secured at: 23 cm Tube secured with: Tape Dental Injury: Teeth and Oropharynx as per pre-operative assessment

## 2015-09-06 NOTE — H&P (Signed)
Pre-procedure H&P  I have reviewed all of my previous notes and data.  He was unable to tolerate FOB with conscious sedation He has RUL endobronchial lesion We will proceed with intubation and propofol so that I may safely obtain endobronchial biopsies  Merton Border, MD PCCM service Mobile [phone] Pager [phone]

## 2015-09-06 NOTE — Procedures (Signed)
Indication:   RUL endobronchial tumor  Sedation: Propofol Intubated for procedure   Anesthesia: inutbated by anesthesia  Procedure: After adequate sedation and anesthesia, the bronchoscope was introduced via the ETT and advanced into the trachea, then RUL. Note that a complete airway exam was performed on previous bronchoscopy attempt.   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

## 2015-09-06 NOTE — Anesthesia Postprocedure Evaluation (Signed)
Anesthesia Post Note  Patient: Johnathan Madden.  Procedure(s) Performed: Procedure(s) (LRB): FLEXIBLE BRONCHOSCOPY (N/A)  Patient location during evaluation: PACU Anesthesia Type: General Level of consciousness: awake Pain management: pain level controlled Vital Signs Assessment: post-procedure vital signs reviewed and stable Respiratory status: spontaneous breathing Cardiovascular status: blood pressure returned to baseline Postop Assessment: no headache Anesthetic complications: no    Last Vitals:  Filed Vitals:   09/06/15 1117 09/06/15 1253  BP: 117/78 98/59  Pulse: 77 82  Temp: 37.5 C 35.9 C  Resp: 16 19    Last Pain:  Filed Vitals:   09/06/15 1325  PainSc: 0-No pain                 Derrall Hicks M

## 2015-09-06 NOTE — Transfer of Care (Signed)
Immediate Anesthesia Transfer of Care Note  Patient: Johnathan Madden.  Procedure(s) Performed: Procedure(s): FLEXIBLE BRONCHOSCOPY (N/A)  Patient Location: PACU  Anesthesia Type:General  Level of Consciousness: awake, alert , oriented and patient cooperative  Airway & Oxygen Therapy: Patient Spontanous Breathing and Patient connected to nasal cannula oxygen  Post-op Assessment: Report given to RN and Post -op Vital signs reviewed and stable  Post vital signs: Reviewed and stable  Last Vitals:  Filed Vitals:   09/06/15 1117 09/06/15 1253  BP: 117/78 98/59  Pulse: 77 82  Temp: 37.5 C 35.9 C  Resp: 16 19    Complications: No apparent anesthesia complications
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
## 2015-09-09 ENCOUNTER — Encounter: Payer: Self-pay | Admitting: Pulmonary Disease

## 2015-09-10 LAB — SURGICAL PATHOLOGY

## 2015-09-10 LAB — CYTOLOGY - NON PAP

## 2015-09-11 ENCOUNTER — Telehealth: Payer: Self-pay | Admitting: *Deleted

## 2015-09-11 NOTE — Telephone Encounter (Signed)
Called spoke with pt. appt scheduled for 1/27 at 9:30. Nothing further needed

## 2015-09-11 NOTE — Telephone Encounter (Signed)
-----   Message from Johnathan Mcardle, MD sent at 09/10/2015  1:54 PM EST ----- Please schedule follow up with me in 3-4 weeks with CXR. I have called him to discuss the biopsy results but he needs follow up [place]

## 2015-10-11 ENCOUNTER — Ambulatory Visit
Admission: RE | Admit: 2015-10-11 | Discharge: 2015-10-11 | Disposition: A | Discharge status: Home / Self Care | Payer: BLUE CROSS/BLUE SHIELD | Source: Ambulatory Visit | Attending: Pulmonary Disease | Admitting: Pulmonary Disease

## 2015-10-11 ENCOUNTER — Ambulatory Visit (INDEPENDENT_AMBULATORY_CARE_PROVIDER_SITE_OTHER): Payer: BLUE CROSS/BLUE SHIELD | Admitting: Pulmonary Disease

## 2015-10-11 ENCOUNTER — Encounter: Payer: Self-pay | Admitting: Pulmonary Disease

## 2015-10-11 VITALS — BP 128/78 | HR 96 | Ht 70.0 in | Wt 266.0 lb

## 2015-10-11 DIAGNOSIS — D1431 Benign neoplasm of right bronchus and lung: Secondary | ICD-10-CM | POA: Diagnosis not present

## 2015-10-11 DIAGNOSIS — Z720 Tobacco use: Secondary | ICD-10-CM

## 2015-10-11 DIAGNOSIS — F17200 Nicotine dependence, unspecified, uncomplicated: Secondary | ICD-10-CM

## 2015-10-11 DIAGNOSIS — R911 Solitary pulmonary nodule: Secondary | ICD-10-CM | POA: Insufficient documentation
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
## 2015-10-14 ENCOUNTER — Telehealth: Payer: Self-pay | Admitting: *Deleted

## 2015-10-14 DIAGNOSIS — D1431 Benign neoplasm of right bronchus and lung: Secondary | ICD-10-CM

## 2015-10-14 NOTE — Telephone Encounter (Signed)
Tried to call pt. VM is full and unable to LM. Will call back later.

## 2015-10-14 NOTE — Telephone Encounter (Signed)
-----   Message from Wilhelmina Mcardle, MD sent at 10/13/2015  3:09 PM EST ----- Please inform him the the tumor cannot be seen on plain CXR. Schedule CT chest with contrast in 3 months prior to his office visit. Cancel the order for CXR in 3 months  Thanks,  Waunita Schooner

## 2015-10-15 NOTE — Addendum Note (Signed)
Addended by: Oscar La R on: 10/15/2015 02:48 PM   Modules accepted: Orders

## 2015-10-15 NOTE — Telephone Encounter (Signed)
3 months is OK.. No need to do it earlier. Chest pain is not related to the tumor. If persists, suggest eval by his primary care MD  Johnathan Madden

## 2015-10-15 NOTE — Telephone Encounter (Signed)
Pt informed. CT order placed. Nothing further needed.

## 2015-10-15 NOTE — Telephone Encounter (Signed)
LMOM for pt to return call. 

## 2015-10-15 NOTE — Telephone Encounter (Signed)
Called pt to inform him of the CT chest w/contrast in 3 months prior to appt. He is asking if you want it sooner since you can't see the tumor. Pt also stating he has been having chest discomfort with pressure and sharp pain since last night in the left side of his chest at the heart. States it is some better this am but still there. States he is taking it easy. Informed pt that it would be in his best interest to go to the ER to be checked out. KK informed of situation and states pt should go to ER.

## 2015-11-16 ENCOUNTER — Ambulatory Visit
Admission: EM | Admit: 2015-11-16 | Discharge: 2015-11-16 | Disposition: A | Discharge status: Home / Self Care | Payer: BLUE CROSS/BLUE SHIELD | Attending: Family Medicine | Admitting: Family Medicine

## 2015-11-16 ENCOUNTER — Ambulatory Visit (INDEPENDENT_AMBULATORY_CARE_PROVIDER_SITE_OTHER): Payer: BLUE CROSS/BLUE SHIELD

## 2015-11-16 ENCOUNTER — Encounter: Payer: Self-pay | Admitting: *Deleted

## 2015-11-16 DIAGNOSIS — J189 Pneumonia, unspecified organism: Secondary | ICD-10-CM | POA: Diagnosis not present

## 2015-11-16 MED ORDER — LEVOFLOXACIN 500 MG PO TABS: 500.0000 mg | ORAL_TABLET | Freq: Every day | ORAL | Status: DC

## 2015-11-16 MED ORDER — ALBUTEROL SULFATE HFA 108 (90 BASE) MCG/ACT IN AERS: 1.0000 | INHALATION_SPRAY | Freq: Four times a day (QID) | RESPIRATORY_TRACT | Status: DC | PRN

## 2015-11-16 MED ORDER — HYDROCOD POLST-CPM POLST ER 10-8 MG/5ML PO SUER: 5.0000 mL | Freq: Two times a day (BID) | ORAL | Status: DC | PRN

## 2015-11-16 NOTE — ED Notes (Signed)
Pt states that he has had cough, congestion and fever that started just almost 2 weeks ago. Taken OTC medications without relief.   Pt also complains of bilateral ear pain that has been going on for a while.

## 2015-11-16 NOTE — ED Provider Notes (Signed)
CSN: [id number]     Arrival date & time 11/16/15  1008 History   First MD Initiated Contact with Patient 11/16/15 1112     Chief Complaint  Patient presents with  . Cough   (Consider location/radiation/quality/duration/timing/severity/associated sxs/prior Treatment) HPI: Patient presents today with symptoms of productive cough, fever which started about 10 days ago. Patient had myalgias at that time which have improved however still has night sweats. Also complains of some bilateral ear pain at times. He states the mucus is colored. He is a former smoker. Denies any chest pain or shortness of breath. Denies any calf pain or swelling.  Past Medical History  Diagnosis Date  . Pulmonary emboli ([place])   . Shortness of breath dyspnea     with activity   Past Surgical History  Procedure Laterality Date  . Bilateral carpal tunnel release    . Flexible bronchoscopy Right 08/30/2015    Procedure: FLEXIBLE BRONCHOSCOPY;  Surgeon: Wilhelmina Mcardle, MD;  Location: [facility] ORS;  Service: Pulmonary;  Laterality: Right;  . Flexible bronchoscopy N/A 09/06/2015    Procedure: FLEXIBLE BRONCHOSCOPY;  Surgeon: Wilhelmina Mcardle, MD;  Location: [facility] ORS;  Service: Pulmonary;  Laterality: N/A;   Family History  Problem Relation Age of Onset  . CAD Father   . Melanoma Mother    Social History  Substance Use Topics  . Smoking status: Former Smoker -- 1.00 packs/day for 30 years    Types: Cigarettes    Quit date: 08/25/2015  . Smokeless tobacco: Former Systems developer    Quit date: 08/18/2015  . Alcohol Use: No    Review of Systems: Negative except mentioned above.   Allergies  Review of patient's allergies indicates no known allergies.  Home Medications   Prior to Admission medications   Medication Sig Start Date End Date Taking? Authorizing Provider  aspirin 81 MG tablet Take 81 mg by mouth daily. Patient not taking at present   Yes Historical Provider, MD   Meds Ordered and Administered this Visit   Medications - No data to display  BP 104/75 mmHg  Pulse 91  Temp(Src) 98 F (36.7 C) (Oral)  Ht 5\' 11"  (1.803 m)  Wt 260 lb (117.935 kg)  BMI 36.28 kg/m2  SpO2 97% No data found.   Physical Exam   GENERAL: NAD HEENT: no pharyngeal erythema, no exudate, no erythema of TMs, bilateral cerumen, no cervical LAD RESP: mild rhonci, minimal expiratory wheezing, no accessory muscle use  CARD: RRR EXTREM: -Homans, no calf swelling NEURO: CN II-XII grossly intact   ED Course  Procedures (including critical care time)  Labs Review Labs Reviewed - No data to display  Imaging Review No results found.     MDM  A/P: Pneumonia- x-ray results suggestive of right sided pneumonia, discussed with the patient that he likely had influenza initially when the symptoms started and may now have a superimposed bacterial infection. I recommended that the patient have a follow-up chest x-ray in 3-4 weeks as requested by the radiologist, patient states that they are currently investigating a "polyp" in his right lung. Patient admits that his current symptoms are different then his past symptoms with a PE. I've asked that if his symptoms do worsen to go immediately to the ER as discussed. Albuterol inhaler w/spacer when necessary, Tussionex when necessary, rest, hydration. Patient does not work this weekend but has been given a work excuse for Monday. If he is better and afebrile he can return to work on Tuesday. I've asked  that he follow-up with his primary care physician within a week as well.  Paulina Fusi, MD 11/16/15 1229

## 2015-11-18 ENCOUNTER — Ambulatory Visit (INDEPENDENT_AMBULATORY_CARE_PROVIDER_SITE_OTHER): Payer: BLUE CROSS/BLUE SHIELD | Admitting: Internal Medicine

## 2015-11-18 ENCOUNTER — Encounter: Payer: Self-pay | Admitting: Internal Medicine

## 2015-11-18 VITALS — BP 118/78 | HR 116 | Temp 98.2°F | Ht 71.0 in | Wt 262.4 lb

## 2015-11-18 DIAGNOSIS — J181 Lobar pneumonia, unspecified organism: Principal | ICD-10-CM

## 2015-11-18 DIAGNOSIS — J189 Pneumonia, unspecified organism: Secondary | ICD-10-CM

## 2015-11-18 NOTE — Patient Instructions (Signed)
Begin Mucinex twice a day.

## 2015-11-18 NOTE — Progress Notes (Signed)
Date:  11/18/2015   Name:  Johnathan Madden.   DOB:  04/11/1961   MRN:  [id number]   Chief Complaint: Pneumonia Pneumonia He complains of chest tightness, cough, hoarse voice and shortness of breath. There is no sputum production or wheezing. This is a new problem. The current episode started in the past 7 days. The problem occurs 2 to 4 times per day. Pertinent negatives include no chest pain, fever or headaches.   He was seen in urgent care 2 days ago. Chest x-ray showed right upper lobe infiltrate. He was started on Levaquin and Tussionex. His been seen recently by pulmonology. He had an endobronchial lesion that was biopsied in December and shown to be a polyp. He has a follow-up CT scan scheduled for April.  Review of Systems  Constitutional: Positive for diaphoresis. Negative for fever and chills.  HENT: Positive for hearing loss and hoarse voice.   Eyes: Negative for visual disturbance.  Respiratory: Positive for cough, chest tightness and shortness of breath. Negative for sputum production and wheezing.   Cardiovascular: Negative for chest pain and palpitations.  Gastrointestinal: Negative for abdominal pain.  Neurological: Negative for dizziness, light-headedness and headaches.    Patient Active Problem List   Diagnosis Date Noted  . Carpal tunnel syndrome 07/16/2015  . Fatigue 07/16/2015  . Fatty infiltration of liver 07/16/2015  . HLD (hyperlipidemia) 07/16/2015  . 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 Compulsive tobacco user syndrome 07/16/2015    Prior to Admission medications   Medication Sig Start Date End Date Taking? Authorizing Provider  albuterol (PROVENTIL HFA;VENTOLIN HFA) 108 (90 Base) MCG/ACT inhaler Inhale 1-2 puffs into the lungs every 6 (six) hours as needed for wheezing or shortness of breath. 11/16/15  Yes Paulina Fusi, MD  aspirin 81 MG tablet Take 81 mg by mouth daily. Patient not taking at present   Yes Historical Provider, MD  chlorpheniramine-HYDROcodone  [facility] PENNKINETIC ER) 10-8 MG/5ML SUER Take 5 mLs by mouth every 12 (twelve) hours as needed for cough (can cause drowsiness). 11/16/15  Yes Paulina Fusi, MD  levofloxacin (LEVAQUIN) 500 MG tablet Take 1 tablet (500 mg total) by mouth daily. 11/16/15  Yes Paulina Fusi, MD    No Known Allergies  Past Surgical History  Procedure Laterality Date  . Bilateral carpal tunnel release    . Flexible bronchoscopy Right 08/30/2015    Procedure: FLEXIBLE BRONCHOSCOPY;  Surgeon: Wilhelmina Mcardle, MD;  Location: [facility] ORS;  Service: Pulmonary;  Laterality: Right;  . Flexible bronchoscopy N/A 09/06/2015    Procedure: FLEXIBLE BRONCHOSCOPY;  Surgeon: Wilhelmina Mcardle, MD;  Location: [facility] ORS;  Service: Pulmonary;  Laterality: N/A;    Social History  Substance Use Topics  . Smoking status: Former Smoker -- 1.00 packs/day for 30 years    Types: Cigarettes    Quit date: 08/25/2015  . Smokeless tobacco: Former Systems developer    Quit date: 08/18/2015  . Alcohol Use: No     Medication list has been reviewed and updated.   Physical Exam  Constitutional: He is oriented to person, place, and time. He appears well-developed. No distress.  HENT:  Head: Normocephalic and atraumatic.  Neck: Normal range of motion. Neck supple.  Cardiovascular: Normal rate, regular rhythm and normal heart sounds.   Pulmonary/Chest: Effort normal. No respiratory distress. He has decreased breath sounds in the right upper field. He has wheezes in the right upper field.  Musculoskeletal: Normal range of motion.  Neurological: He is alert and oriented to person, place,  and time.  Skin: Skin is warm and dry. No rash noted.  Psychiatric: He has a normal mood and affect. His speech is normal and behavior is normal. Thought content normal.  Nursing note and vitals reviewed.   BP 118/78 mmHg  Pulse 116  Temp(Src) 98.2 F (36.8 C) (Oral)  Ht 5\' 11"  (1.803 m)  Wt 262 lb 6.4 oz (119.024 kg)  BMI 36.61 kg/m2  SpO2 95%  Assessment and  Plan: 1. Right upper lobe pneumonia Finish antibiotics; use ventolin twice a day Resume Mucinex and continue cough syrup as needed Proceed with CT chest as planned in August Note given to be out of work until 11/22/15.   [name], MD [facility]  11/18/2015

## 2016-01-01 DIAGNOSIS — Z86711 Personal history of pulmonary embolism: Secondary | ICD-10-CM | POA: Insufficient documentation

## 2016-01-02 ENCOUNTER — Ambulatory Visit: Admission: RE | Admit: 2016-01-02 | Payer: BLUE CROSS/BLUE SHIELD | Source: Ambulatory Visit

## 2016-01-03 ENCOUNTER — Ambulatory Visit: Payer: BLUE CROSS/BLUE SHIELD | Admitting: Pulmonary Disease

## 2017-06-02 ENCOUNTER — Encounter: Payer: Self-pay | Admitting: Internal Medicine

## 2017-06-02 ENCOUNTER — Ambulatory Visit (INDEPENDENT_AMBULATORY_CARE_PROVIDER_SITE_OTHER): Payer: BLUE CROSS/BLUE SHIELD | Admitting: Internal Medicine

## 2017-06-02 VITALS — BP 128/78 | HR 91 | Ht 71.0 in | Wt 278.0 lb

## 2017-06-02 DIAGNOSIS — S39012A Strain of muscle, fascia and tendon of lower back, initial encounter: Secondary | ICD-10-CM | POA: Diagnosis not present

## 2017-06-02 NOTE — Patient Instructions (Signed)
Continue Meloxicam 15 mg daily  Add Robaxin at bedtime

## 2017-06-02 NOTE — Progress Notes (Signed)
Date:  06/02/2017   Name:  Johnathan Madden.   DOB:  [birth year]   MRN:  [id number]   Chief Complaint: Back Pain (Lower Back Pain- Started 05/05/2017. tried to get workers comp but was denied. Having difficulty lifting. On light duty and only able to pick up 30 lbs. Seen [employer]- Workers Comp place. Never had Xray or MRI. Does heavy liefting for job. Once every year or so has a back issue. Always been right side until this time its the left side. Was told prevously he has DDD- lumbar. Back brace X 1 week helping.  )  Back Pain  This is a new problem. The current episode started 1 to 4 weeks ago. The problem occurs constantly. The problem is unchanged. The pain is present in the lumbar spine. The quality of the pain is described as shooting and stabbing. The pain radiates to the left knee. The pain is mild. The symptoms are aggravated by standing, twisting and stress. Associated symptoms include bladder incontinence (one episode), tingling and weakness. Pertinent negatives include no bowel incontinence, chest pain or perianal numbness. He has tried NSAIDs for the symptoms. The treatment provided moderate relief.  Reports fairly significant improvement over the past several weeks with meloxicam and reduced lifting requirements at work. He is currently on a 25 pound weight limit but feels that he could increase to 50.   Review of Systems  Constitutional: Negative for chills and fatigue.  Eyes: Negative for visual disturbance.  Respiratory: Negative for chest tightness and shortness of breath.   Cardiovascular: Negative for chest pain and palpitations.  Gastrointestinal: Negative for bowel incontinence, constipation and diarrhea.  Genitourinary: Positive for bladder incontinence (one episode). Negative for difficulty urinating.  Musculoskeletal: Positive for back pain and myalgias (left lower back and sacro-iliac area). Negative for joint swelling.  Skin: Negative for color change and rash.    Neurological: Positive for tingling and weakness.    Patient Active Problem List   Diagnosis Date Noted  . Carpal tunnel syndrome 07/16/2015  . Fatigue 07/16/2015  . Fatty infiltration of liver 07/16/2015  . HLD (hyperlipidemia) 07/16/2015  . 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 Compulsive tobacco user syndrome 07/16/2015    Prior to Admission medications   Medication Sig Start Date End Date Taking? Authorizing Provider  aspirin 81 MG tablet Take 81 mg by mouth daily. Patient not taking at present   Yes [provider]    No Known Allergies  Past Surgical History:  Procedure Laterality Date  . BILATERAL CARPAL TUNNEL RELEASE    . FLEXIBLE BRONCHOSCOPY Right 08/30/2015   Procedure: FLEXIBLE BRONCHOSCOPY;  Surgeon: Wilhelmina Mcardle, MD;  Location: [facility] ORS;  Service: Pulmonary;  Laterality: Right;  . FLEXIBLE BRONCHOSCOPY N/A 09/06/2015   Procedure: FLEXIBLE BRONCHOSCOPY;  Surgeon: Wilhelmina Mcardle, MD;  Location: [facility] OR[facility];  Service: Pulmonary;  Laterality: N/A;    Social History  Substance Use Topics  . Smoking status: Former Smoker    Packs/day: 1.00    Years: 30.00    Types: Cigarettes    Quit date: 08/25/2015  . Smokeless tobacco: Former Systems developer    Quit date: 08/18/2015  . Alcohol use No     Medication list has been reviewed and updated.  PHQ 2/9 Scores 06/02/2017  PHQ - 2 Score 0    Physical Exam  Constitutional: He is oriented to person, place, and time. He appears well-developed. No distress.  HENT:  Head: Normocephalic and atraumatic.  Neck: Normal range of motion.  Neck supple. No thyromegaly present.  Cardiovascular: Normal rate, regular rhythm and normal heart sounds.   Pulmonary/Chest: Effort normal and breath sounds normal. No respiratory distress. He has no wheezes.  Musculoskeletal: He exhibits no edema.       Lumbar back: He exhibits normal range of motion and no tenderness.  Neurological: He is alert and oriented to person, place, and time. He has  normal strength and normal reflexes. No sensory deficit. Coordination and gait normal.  Slightly positive SLR on Right while sitting, negative on Left.  Skin: Skin is warm and dry. No rash noted.  Psychiatric: He has a normal mood and affect. His behavior is normal. Thought content normal.  Nursing note and vitals reviewed.   BP 128/78   Pulse 91   Ht 5\' 11"  (1.803 m)   Wt 278 lb (126.1 kg)   SpO2 94%   BMI 38.77 kg/m   Assessment and Plan: 1. Strain of lumbar region, initial encounter Improving with conservative management Continue Mobic Add robaxin at HS Advance to 50 lbs for 2 weeks then 75 lbs for 2 weeks then return to no restrictions   No orders of the defined types were placed in this encounter.   Partially dictated using Editor, commissioning. Any errors are unintentional.  [name], MD [facility]  06/02/2017

## 2017-06-15 ENCOUNTER — Encounter: Payer: Self-pay | Admitting: Internal Medicine

## 2017-06-15 ENCOUNTER — Ambulatory Visit (INDEPENDENT_AMBULATORY_CARE_PROVIDER_SITE_OTHER): Payer: BLUE CROSS/BLUE SHIELD | Admitting: Internal Medicine

## 2017-06-15 VITALS — BP 132/94 | HR 85 | Ht 71.0 in | Wt 278.0 lb

## 2017-06-15 DIAGNOSIS — M5416 Radiculopathy, lumbar region: Secondary | ICD-10-CM | POA: Diagnosis not present

## 2017-06-15 MED ORDER — PREDNISONE 10 MG PO TABS: ORAL_TABLET | ORAL | 0 refills | Status: DC

## 2017-06-15 NOTE — Progress Notes (Signed)
Date:  06/15/2017   Name:  Johnathan Madden.   DOB:  03-18-1961   MRN:  [id number]   Chief Complaint: Back Pain (Left lower back still painful with pain going down left thigh. Holding straight feels better. Putting pressure and walking causes left sided pain.  Even just sitting pain radiating from left hip down into thigh. Can't walk very far before having to stop and get off left leg. ) Onset of lower back symptoms were in early August without known injury. 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 2 weeks ago he was seen with symptoms that had not improved with non steroidals. I recommend limited lifting as well as muscle relaxers and anti-inflammatories. He's been resting in between is not doing any extra activities. However the pain in his lower back is persisted and now is having more shooting pains from his lateral thigh was a sensation of weakness. No loss of bowel or bladder control. No falls.   Review of Systems  Constitutional: Negative for chills, fatigue and fever.  Respiratory: Negative for chest tightness and shortness of breath.   Cardiovascular: Negative for chest pain and palpitations.  Genitourinary: Negative for difficulty urinating.  Musculoskeletal: Positive for arthralgias, back pain and gait problem.  Neurological: Positive for weakness (and shooting pain down lateral left thigh to knee). Negative for dizziness, tremors, seizures and numbness.    Patient Active Problem List   Diagnosis Date Noted  . Carpal tunnel syndrome 07/16/2015  . Fatigue 07/16/2015  . Fatty infiltration of liver 07/16/2015  . HLD (hyperlipidemia) 07/16/2015  . 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 Compulsive tobacco user syndrome 07/16/2015    Prior to Admission medications   Medication Sig Start Date End Date Taking? Authorizing Provider  aspirin 81 MG tablet Take 81 mg by mouth daily. Patient not taking at present   Yes [provider]  methocarbamol (ROBAXIN) 500 MG tablet Take 500 mg by mouth daily.   Yes [provider]    No Known Allergies  Past Surgical History:  Procedure Laterality Date  . BILATERAL CARPAL TUNNEL RELEASE    . FLEXIBLE BRONCHOSCOPY Right 08/30/2015   Procedure: FLEXIBLE BRONCHOSCOPY;  Surgeon: Wilhelmina Mcardle, MD;  Location: [facility] ORS;  Service: Pulmonary;  Laterality: Right;  . FLEXIBLE BRONCHOSCOPY N/A 09/06/2015   Procedure: FLEXIBLE BRONCHOSCOPY;  Surgeon: Wilhelmina Mcardle, MD;  Location: [facility] ORS;  Service: Pulmonary;  Laterality: N/A;    Social History  Substance Use Topics  . Smoking status: Former Smoker    Packs/day: 1.00    Years: 30.00    Types: Cigarettes    Quit date: 08/25/2015  . Smokeless tobacco: Former Systems developer    Quit date: 08/18/2015  . Alcohol use No     Medication list has been reviewed and updated.  PHQ 2/9 Scores 06/02/2017  PHQ - 2 Score 0    Physical Exam  Constitutional: He is oriented to person, place, and time. He appears well-developed. No distress.  HENT:  Head: Normocephalic and atraumatic.  Neck: Normal range of motion. Neck supple.  Cardiovascular: Normal rate, regular rhythm and normal heart sounds.   Pulmonary/Chest: Effort normal and breath sounds normal. No respiratory distress. He has no wheezes.  Musculoskeletal: Normal range of motion.       Lumbar back: He exhibits tenderness. He exhibits no spasm.       Back:  Neurological: He is alert  and oriented to person, place, and time. He has normal strength. No sensory deficit. Gait (antalgic) abnormal.  Reflex Scores:      Patellar reflexes are 2+ on the right side and 1+ on the left side.      Achilles reflexes are 1+ on the right side and 1+ on the left side. SLR + on left at 90 deg supine   Skin: Skin is warm and dry. No rash noted.  Psychiatric: He has a normal mood and affect. His behavior is normal. Thought content normal.  Nursing note and vitals reviewed.   BP  (!) 132/94   Pulse 85   Ht 5\' 11"  (1.803 m)   Wt 278 lb (126.1 kg)   SpO2 96%   BMI 38.77 kg/m   Assessment and Plan: 1. Lumbar back pain with radiculopathy affecting left lower extremity Failed 6 weeks of conservative therapy, muscle relaxants and non-steroidals Note to limit lifting to 50 lbs until further notice - predniSONE (DELTASONE) 10 MG tablet; Take 6 on day 1, 5 on day 2, 4 on day 3, 3 on day 4, 2 on day 5 and 1 on day 1 then stop.  Dispense: 21 tablet; Refill: 0 - MR Lumbar Spine Wo Contrast; Future   Meds ordered this encounter  Medications  . predniSONE (DELTASONE) 10 MG tablet    Sig: Take 6 on day 1, 5 on day 2, 4 on day 3, 3 on day 4, 2 on day 5 and 1 on day 1 then stop.    Dispense:  21 tablet    Refill:  0    Partially dictated using Editor, commissioning. Any errors are unintentional.  [name], MD [facility]  06/15/2017

## 2017-06-18 ENCOUNTER — Ambulatory Visit
Admission: RE | Admit: 2017-06-18 | Discharge: 2017-06-18 | Disposition: A | Discharge status: Home / Self Care | Payer: BLUE CROSS/BLUE SHIELD | Source: Ambulatory Visit | Attending: Internal Medicine | Admitting: Internal Medicine

## 2017-06-18 DIAGNOSIS — M1288 Other specific arthropathies, not elsewhere classified, other specified site: Secondary | ICD-10-CM | POA: Diagnosis not present

## 2017-06-18 DIAGNOSIS — M5416 Radiculopathy, lumbar region: Secondary | ICD-10-CM | POA: Diagnosis present

## 2017-06-18 DIAGNOSIS — M4807 Spinal stenosis, lumbosacral region: Secondary | ICD-10-CM | POA: Diagnosis not present

## 2017-06-18 DIAGNOSIS — M5116 Intervertebral disc disorders with radiculopathy, lumbar region: Secondary | ICD-10-CM | POA: Diagnosis not present

## 2017-06-18 DIAGNOSIS — M5127 Other intervertebral disc displacement, lumbosacral region: Secondary | ICD-10-CM | POA: Diagnosis not present

## 2017-12-06 ENCOUNTER — Ambulatory Visit (INDEPENDENT_AMBULATORY_CARE_PROVIDER_SITE_OTHER): Payer: BLUE CROSS/BLUE SHIELD | Admitting: Internal Medicine

## 2017-12-06 ENCOUNTER — Encounter: Payer: Self-pay | Admitting: Internal Medicine

## 2017-12-06 VITALS — BP 128/80 | HR 113 | Ht 71.0 in | Wt 275.0 lb

## 2017-12-06 DIAGNOSIS — R809 Proteinuria, unspecified: Secondary | ICD-10-CM

## 2017-12-06 DIAGNOSIS — R509 Fever, unspecified: Secondary | ICD-10-CM | POA: Diagnosis not present

## 2017-12-06 LAB — POC URINALYSIS WITH MICROSCOPIC (NON AUTO)MANUAL RESULT
Bilirubin, UA: NEGATIVE
CRYSTALS: 0
GLUCOSE UA: NEGATIVE
Ketones, UA: NEGATIVE
Leukocytes, UA: NEGATIVE
MUCUS UA: 0
NITRITE UA: NEGATIVE
RBC: 0 M/uL — AB (ref 4.69–6.13)
Spec Grav, UA: >=1.03 — AB (ref 1.010–1.025)
Urobilinogen, UA: 0.2 E.U./dL
WBC Casts, UA: 5
pH, UA: 6 (ref 5.0–8.0)

## 2017-12-06 NOTE — Progress Notes (Signed)
Date:  12/06/2017   Name:  Johnathan Madden.   DOB:  September 29, 1960   MRN:  [id number]   Chief Complaint: Hematuria (X 3 days. Started off dark orange color and today urine was full red and bloody. No pain. )  Hematuria  This is a new problem. The current episode started in the past 7 days. The problem has been gradually worsening since onset. He is experiencing no pain. He describes his urine color as tea colored. Associated symptoms include chills and fever. Pertinent negatives include no abdominal pain, dysuria or nausea.  Fever   This is a new problem. The current episode started in the past 7 days. The problem has been waxing and waning. His temperature was unmeasured prior to arrival. Associated symptoms include coughing and ear pain. Pertinent negatives include no abdominal pain, chest pain, diarrhea, headaches, nausea, urinary pain or wheezing. He has tried acetaminophen (just got Rx for amoxicillin from ENT) for the symptoms.      Review of Systems  Constitutional: Positive for chills and fever.  HENT: Positive for ear pain.   Eyes: Negative for visual disturbance.  Respiratory: Positive for cough. Negative for chest tightness, shortness of breath and wheezing.   Cardiovascular: Negative for chest pain and palpitations.  Gastrointestinal: Negative for abdominal pain, constipation, diarrhea and nausea.  Genitourinary: Positive for hematuria. Negative for discharge and dysuria.  Musculoskeletal: Negative for arthralgias.  Neurological: Negative for dizziness and headaches.    Patient Active Problem List   Diagnosis Date Noted  . Carpal tunnel syndrome 07/16/2015  . Fatigue 07/16/2015  . Fatty infiltration of liver 07/16/2015  . HLD (hyperlipidemia) 07/16/2015  . 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 Compulsive tobacco user syndrome 07/16/2015    Prior to Admission medications   Medication Sig Start Date End Date Taking? Authorizing Provider  aspirin 81 MG tablet Take 81 mg by  mouth daily. Patient not taking at present   Yes [provider]    No Known Allergies  Past Surgical History:  Procedure Laterality Date  . BILATERAL CARPAL TUNNEL RELEASE    . FLEXIBLE BRONCHOSCOPY Right 08/30/2015   Procedure: FLEXIBLE BRONCHOSCOPY;  Surgeon: Wilhelmina Mcardle, MD;  Location: [facility] ORS;  Service: Pulmonary;  Laterality: Right;  . FLEXIBLE BRONCHOSCOPY N/A 09/06/2015   Procedure: FLEXIBLE BRONCHOSCOPY;  Surgeon: Wilhelmina Mcardle, MD;  Location: [facility] ORS;  Service: Pulmonary;  Laterality: N/A;    Social History   Tobacco Use  . Smoking status: Former Smoker    Packs/day: 1.00    Years: 30.00    Pack years: 30.00    Types: Cigarettes    Last attempt to quit: 08/25/2015    Years since quitting: 2.2  . Smokeless tobacco: Former Systems developer    Quit date: 08/18/2015  Substance Use Topics  . Alcohol use: No    Alcohol/week: 2.4 oz    Types: 4 Standard drinks or equivalent per week  . Drug use: No     Medication list has been reviewed and updated.  PHQ 2/9 Scores 06/02/2017  PHQ - 2 Score 0    Physical Exam  Constitutional: He appears well-developed and well-nourished.  HENT:  Right Ear: Ear canal normal. Tympanic membrane is erythematous.  Left Ear: Ear canal normal. Tympanic membrane is erythematous.  Nose: Right sinus exhibits no maxillary sinus tenderness. Left sinus exhibits no maxillary sinus tenderness.  Mouth/Throat: No posterior oropharyngeal edema or posterior oropharyngeal erythema.  Neck: Normal range of motion. Neck supple.  Cardiovascular: Normal rate,  regular rhythm, normal heart sounds and normal pulses.  Pulmonary/Chest: Effort normal and breath sounds normal. No respiratory distress. He has no wheezes. He has no rhonchi.  Abdominal: There is no tenderness. There is no CVA tenderness.  Skin: Skin is warm and dry.    BP 128/80   Pulse (!) 113   Ht 5\' 11"  (1.803 m)   Wt 275 lb (124.7 kg)   SpO2 96%   BMI 38.35 kg/m   Assessment and  Plan: 1. Proteinuria, unspecified type Increase fluids 80-100 oz per day Check BUN/CR to rule out ARF - POC urinalysis w microscopic (non auto) - Comprehensive metabolic panel  2. Fever, unspecified fever cause Continue tylenol Take full course of PCN given by ENT   No orders of the defined types were placed in this encounter.   Partially dictated using Editor, commissioning. Any errors are unintentional.  [name], MD [facility]  12/06/2017

## 2017-12-06 NOTE — Patient Instructions (Signed)
Drink 80-100 oz of fluid per day - at least half of that needs to be water or unsweet tea.  Take all the antibiotics given by ENT

## 2017-12-07 LAB — COMPREHENSIVE METABOLIC PANEL
ALK PHOS: 112 IU/L (ref 39–117)
ALT: 40 IU/L (ref 0–44)
AST: 26 IU/L (ref 0–40)
Albumin/Globulin Ratio: 1.2 (ref 1.2–2.2)
Albumin: 4.1 g/dL (ref 3.5–5.5)
BILIRUBIN TOTAL: 1.2 mg/dL (ref 0.0–1.2)
BUN / CREAT RATIO: 13 (ref 9–20)
BUN: 15 mg/dL (ref 6–24)
CHLORIDE: 99 mmol/L (ref 96–106)
CO2: 25 mmol/L (ref 20–29)
Calcium: 9.6 mg/dL (ref 8.7–10.2)
Creatinine, Ser: 1.18 mg/dL (ref 0.76–1.27)
GFR calc Af Amer: 80 mL/min/{1.73_m2} (ref >59)
GFR calc non Af Amer: 70 mL/min/{1.73_m2} (ref >59)
GLUCOSE: 99 mg/dL (ref 65–99)
Globulin, Total: 3.5 g/dL (ref 1.5–4.5)
POTASSIUM: 4.3 mmol/L (ref 3.5–5.2)
Sodium: 143 mmol/L (ref 134–144)
Total Protein: 7.6 g/dL (ref 6.0–8.5)

## 2017-12-31 ENCOUNTER — Encounter: Payer: Self-pay | Admitting: Internal Medicine

## 2017-12-31 ENCOUNTER — Ambulatory Visit (INDEPENDENT_AMBULATORY_CARE_PROVIDER_SITE_OTHER): Payer: BLUE CROSS/BLUE SHIELD | Admitting: Internal Medicine

## 2017-12-31 VITALS — BP 124/78 | HR 92 | Ht 71.0 in | Wt 274.0 lb

## 2017-12-31 DIAGNOSIS — Z1159 Encounter for screening for other viral diseases: Secondary | ICD-10-CM

## 2017-12-31 DIAGNOSIS — Z0000 Encounter for general adult medical examination without abnormal findings: Secondary | ICD-10-CM

## 2017-12-31 DIAGNOSIS — R3914 Feeling of incomplete bladder emptying: Secondary | ICD-10-CM

## 2017-12-31 DIAGNOSIS — Z1211 Encounter for screening for malignant neoplasm of colon: Secondary | ICD-10-CM | POA: Diagnosis not present

## 2017-12-31 DIAGNOSIS — H9191 Unspecified hearing loss, right ear: Secondary | ICD-10-CM | POA: Diagnosis not present

## 2017-12-31 DIAGNOSIS — E782 Mixed hyperlipidemia: Secondary | ICD-10-CM | POA: Diagnosis not present

## 2017-12-31 DIAGNOSIS — K760 Fatty (change of) liver, not elsewhere classified: Secondary | ICD-10-CM

## 2017-12-31 DIAGNOSIS — D1430 Benign neoplasm of unspecified bronchus and lung: Secondary | ICD-10-CM | POA: Diagnosis not present

## 2017-12-31 DIAGNOSIS — N401 Enlarged prostate with lower urinary tract symptoms: Secondary | ICD-10-CM | POA: Diagnosis not present

## 2017-12-31 DIAGNOSIS — Z114 Encounter for screening for human immunodeficiency virus [HIV]: Secondary | ICD-10-CM | POA: Diagnosis not present

## 2017-12-31 DIAGNOSIS — Z125 Encounter for screening for malignant neoplasm of prostate: Secondary | ICD-10-CM | POA: Diagnosis not present

## 2017-12-31 LAB — POCT URINALYSIS DIPSTICK
BILIRUBIN UA: NEGATIVE
GLUCOSE UA: NEGATIVE
Ketones, UA: NEGATIVE
Leukocytes, UA: NEGATIVE
Nitrite, UA: NEGATIVE
Spec Grav, UA: 1.02 (ref 1.010–1.025)
Urobilinogen, UA: 0.2 E.U./dL
pH, UA: 6 (ref 5.0–8.0)

## 2017-12-31 MED ORDER — TAMSULOSIN HCL 0.4 MG PO CAPS: 0.4000 mg | ORAL_CAPSULE | Freq: Every day | ORAL | 3 refills | Status: DC

## 2017-12-31 NOTE — Progress Notes (Signed)
Date:  12/31/2017   Name:  Johnathan Madden.   DOB:  06-Sep-1961   MRN:  [id number]   Chief Complaint: Annual Exam Johnathan Madden. is a 57 y.o. male who presents today for his Complete Annual Exam. He feels fairly well. He reports exercising none - just physical job. He reports he is sleeping well. He is overdue for colonoscopy.  He has recovered from his recent illness/dehydration.  Ear Fullness   This is a chronic problem. The problem occurs constantly. There has been no fever. The patient is experiencing no pain. Associated symptoms include ear discharge and hearing loss. Pertinent negatives include no abdominal pain, diarrhea, headaches or rash. Treatments tried: had ears cleaned but still feels full.  Urinary Frequency   This is a chronic problem. The problem has been gradually worsening. The patient is experiencing no pain. There has been no fever. Associated symptoms include frequency and urgency. Pertinent negatives include no hematuria. Associated symptoms comments: And sensation of incomplete emptying .  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  He never had follow up.  His breathing is stable - he has some shortness of breath with extreme exertion but cough, hemoptysis.  Review of Systems  Constitutional: Negative for appetite change, diaphoresis, fatigue and unexpected weight change.  HENT: Positive for ear discharge and hearing loss. Negative for tinnitus, trouble swallowing and voice change.   Eyes: Negative for visual disturbance.  Respiratory: Negative for choking, shortness of breath and wheezing.   Cardiovascular: Negative for chest pain, palpitations and leg swelling.  Gastrointestinal: Negative for abdominal pain, blood in stool, constipation and diarrhea.  Genitourinary: Positive for frequency and urgency. Negative for decreased urine volume, difficulty urinating, discharge and hematuria.  Musculoskeletal: Negative for arthralgias,  back pain and myalgias.  Skin: Negative for color change and rash.  Allergic/Immunologic: Negative for environmental allergies.  Neurological: Negative for dizziness, syncope and headaches.  Hematological: Negative for adenopathy.  Psychiatric/Behavioral: Negative for dysphoric mood and sleep disturbance.    Patient Active Problem List   Diagnosis Date Noted  . History of pulmonary embolus (PE) 01/01/2016  . Carpal tunnel syndrome 07/16/2015  . Fatigue 07/16/2015  . Fatty infiltration of liver 07/16/2015  . HLD (hyperlipidemia) 07/16/2015  . 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 Compulsive tobacco user syndrome 07/16/2015    Prior to Admission medications   Medication Sig Start Date End Date Taking? Authorizing Provider  aspirin 81 MG tablet Take 81 mg by mouth daily. Patient not taking at present    [provider]    No Known Allergies  Past Surgical History:  Procedure Laterality Date  . BILATERAL CARPAL TUNNEL RELEASE    . FLEXIBLE BRONCHOSCOPY Right 08/30/2015   Procedure: FLEXIBLE BRONCHOSCOPY;  Surgeon: Wilhelmina Mcardle, MD;  Location: [facility] ORS;  Service: Pulmonary;  Laterality: Right;  . FLEXIBLE BRONCHOSCOPY N/A 09/06/2015   Procedure: FLEXIBLE BRONCHOSCOPY;  Surgeon: Wilhelmina Mcardle, MD;  Location: [facility] ORS;  Service: Pulmonary;  Laterality: N/A;    Social History   Tobacco Use  . Smoking status: Former Smoker    Packs/day: 1.00    Years: 30.00    Pack years: 30.00    Types: Cigarettes    Last attempt to quit: 08/25/2015    Years since quitting: 2.3  . Smokeless tobacco: Former Systems developer    Quit date: 08/18/2015  Substance Use Topics  . Alcohol use: No    Alcohol/week: 2.4 oz    Types: 4  Standard drinks or equivalent per week  . Drug use: No     Medication list has been reviewed and updated.  PHQ 2/9 Scores 06/02/2017  PHQ - 2 Score 0    Physical Exam  Constitutional: He is oriented to person, place, and time. He appears well-developed and well-nourished.  No distress.  HENT:  Head: Normocephalic and atraumatic.  Right Ear: Tympanic membrane, external ear and ear canal normal.  Left Ear: Tympanic membrane, external ear and ear canal normal.  Nose: Nose normal.  Mouth/Throat: Uvula is midline and oropharynx is clear and moist.  Eyes: Pupils are equal, round, and reactive to light. Conjunctivae and EOM are normal.  Neck: Normal range of motion. Neck supple. Carotid bruit is not present. No thyromegaly present.  Cardiovascular: Normal rate, regular rhythm, normal heart sounds and intact distal pulses.  Pulmonary/Chest: Effort normal and breath sounds normal. No respiratory distress. He has no wheezes. Right breast exhibits no mass. Left breast exhibits no mass.  Abdominal: Soft. Normal appearance and bowel sounds are normal. There is no hepatosplenomegaly. There is no tenderness.  Genitourinary: Rectum normal.  Genitourinary Comments: Unable to palpate prostate due to pt habitus  Musculoskeletal: Normal range of motion.  Lymphadenopathy:    He has no cervical adenopathy.  Neurological: He is alert and oriented to person, place, and time. He has normal reflexes.  Skin: Skin is warm, dry and intact. No rash noted.  Psychiatric: He has a normal mood and affect. His speech is normal and behavior is normal. Judgment and thought content normal.  Nursing note and vitals reviewed.   BP 124/78   Pulse 92   Ht 5\' 11"  (1.803 m)   Wt 274 lb (124.3 kg)   SpO2 92%   BMI 38.22 kg/m   Assessment and Plan: 1. Annual physical exam - CBC with Differential/Platelet - POCT urinalysis dipstick  2. Prostate cancer screening Unable to perform DRE Will start Flomax and check PSA - PSA  3. Mixed hyperlipidemia Check labs and advise - Lipid panel  4. Fatty infiltration of liver Discuss low fat diet - Comprehensive metabolic panel  5. Need for hepatitis C screening test - Hepatitis C antibody  6. Colon cancer screening - Ambulatory referral to  Gastroenterology  7. Encounter for screening for HIV - HIV antibody  8. Benign prostatic hyperplasia with incomplete bladder emptying Start Flomax - tamsulosin (FLOMAX) 0.4 MG CAPS capsule; Take 1 capsule (0.4 mg total) by mouth at bedtime.  Dispense: 30 capsule; Refill: 3  9. Benign neoplasm of bronchus or lung, unspecified laterality Recommend follow up with [facility]  10. Decreased hearing of right ear Recommend follow up with ENT   Meds ordered this encounter  Medications  . tamsulosin (FLOMAX) 0.4 MG CAPS capsule    Sig: Take 1 capsule (0.4 mg total) by mouth at bedtime.    Dispense:  30 capsule    Refill:  3    Partially dictated using Editor, commissioning. Any errors are unintentional.  [name], MD [facility]  12/31/2017

## 2017-12-31 NOTE — Patient Instructions (Signed)
Call ENT to evaluate ears/hearing  Consider follow up at [place] will call you about scheduling the colonoscopy

## 2018-01-01 LAB — CBC WITH DIFFERENTIAL/PLATELET
BASOS ABS: 0 10*3/uL (ref 0.0–0.2)
Basos: 1 %
EOS (ABSOLUTE): 0.1 10*3/uL (ref 0.0–0.4)
EOS: 3 %
HEMATOCRIT: 46 % (ref 37.5–51.0)
Hemoglobin: 15.7 g/dL (ref 13.0–17.7)
IMMATURE GRANULOCYTES: 0 %
Immature Grans (Abs): 0 10*3/uL (ref 0.0–0.1)
Lymphocytes Absolute: 1.5 10*3/uL (ref 0.7–3.1)
Lymphs: 36 %
MCH: 32.4 pg (ref 26.6–33.0)
MCHC: 34.1 g/dL (ref 31.5–35.7)
MCV: 95 fL (ref 79–97)
MONOS ABS: 0.3 10*3/uL (ref 0.1–0.9)
Monocytes: 8 %
NEUTROS PCT: 52 %
Neutrophils Absolute: 2.2 10*3/uL (ref 1.4–7.0)
PLATELETS: 246 10*3/uL (ref 150–379)
RBC: 4.84 x10E6/uL (ref 4.14–5.80)
RDW: 13.3 % (ref 12.3–15.4)
WBC: 4.2 10*3/uL (ref 3.4–10.8)

## 2018-01-01 LAB — LIPID PANEL
Chol/HDL Ratio: 5.1 ratio — ABNORMAL HIGH (ref 0.0–5.0)
Cholesterol, Total: 202 mg/dL — ABNORMAL HIGH (ref 100–199)
HDL: 40 mg/dL
LDL Calculated: 109 mg/dL — ABNORMAL HIGH (ref 0–99)
Triglycerides: 264 mg/dL — ABNORMAL HIGH (ref 0–149)
VLDL Cholesterol Cal: 53 mg/dL — ABNORMAL HIGH (ref 5–40)

## 2018-01-01 LAB — COMPREHENSIVE METABOLIC PANEL
ALK PHOS: 105 IU/L (ref 39–117)
ALT: 46 IU/L — AB (ref 0–44)
AST: 28 IU/L (ref 0–40)
Albumin/Globulin Ratio: 1.3 (ref 1.2–2.2)
Albumin: 4.3 g/dL (ref 3.5–5.5)
BUN/Creatinine Ratio: 15 (ref 9–20)
BUN: 15 mg/dL (ref 6–24)
Bilirubin Total: 0.6 mg/dL (ref 0.0–1.2)
CO2: 23 mmol/L (ref 20–29)
CREATININE: 0.99 mg/dL (ref 0.76–1.27)
Calcium: 9.1 mg/dL (ref 8.7–10.2)
Chloride: 107 mmol/L — ABNORMAL HIGH (ref 96–106)
GFR calc Af Amer: 99 mL/min/{1.73_m2} (ref >59)
GFR calc non Af Amer: 86 mL/min/{1.73_m2} (ref >59)
GLUCOSE: 94 mg/dL (ref 65–99)
Globulin, Total: 3.2 g/dL (ref 1.5–4.5)
Potassium: 4.5 mmol/L (ref 3.5–5.2)
Sodium: 144 mmol/L (ref 134–144)
Total Protein: 7.5 g/dL (ref 6.0–8.5)

## 2018-01-01 LAB — HEPATITIS C ANTIBODY: Hep C Virus Ab: <0.1 {s_co_ratio} (ref 0.0–0.9)

## 2018-01-01 LAB — PSA: Prostate Specific Ag, Serum: 2.6 ng/mL (ref 0.0–4.0)

## 2018-01-01 LAB — HIV ANTIBODY (ROUTINE TESTING W REFLEX): HIV Screen 4th Generation wRfx: NONREACTIVE

## 2018-01-10 ENCOUNTER — Telehealth: Payer: Self-pay

## 2018-01-10 NOTE — Telephone Encounter (Signed)
Gastroenterology Pre-Procedure Review  Request Date: 01/28/18 Requesting Physician: Dr. Allen Norris   PATIENT REVIEW QUESTIONS: The patient responded to the following health history questions as indicated:    1. Are you having any GI issues? No  2. Do you have a personal history of Polyps? No  3. Do you have a family history of Colon Cancer or Polyps? No  4. Diabetes Mellitus? No  5. Joint replacements in the past 12 months? No  6. Major health problems in the past 3 months? No  7. Any artificial heart valves, MVP, or defibrillator? No     MEDICATIONS & ALLERGIES:    Patient reports the following regarding taking any anticoagulation/antiplatelet therapy:   Plavix, Coumadin, Eliquis, Xarelto, Lovenox, Pradaxa, Brilinta, or Effient? No  Aspirin? No   Patient confirms/reports the following medications:  Current Outpatient Medications  Medication Sig Dispense Refill  . aspirin 81 MG tablet Take 81 mg by mouth daily. Patient not taking at present    . tamsulosin (FLOMAX) 0.4 MG CAPS capsule Take 1 capsule (0.4 mg total) by mouth at bedtime. 30 capsule 3   No current facility-administered medications for this visit.     Patient confirms/reports the following allergies:  No Known Allergies  No orders of the defined types were placed in this encounter.   AUTHORIZATION INFORMATION Primary Insurance: 1D#: Group #:  Secondary Insurance: 1D#: Group #:  SCHEDULE INFORMATION: Date: 01/28/18 Time: Location: [place]

## 2018-01-12 ENCOUNTER — Other Ambulatory Visit: Payer: Self-pay

## 2018-01-12 DIAGNOSIS — Z1211 Encounter for screening for malignant neoplasm of colon: Secondary | ICD-10-CM

## 2018-01-12 MED ORDER — NA SULFATE-K SULFATE-MG SULF 17.5-3.13-1.6 GM/177ML PO SOLN: 1.0000 | Freq: Once | ORAL | 0 refills | Status: AC

## 2018-01-14 ENCOUNTER — Ambulatory Visit
Admission: EM | Admit: 2018-01-14 | Discharge: 2018-01-14 | Disposition: A | Discharge status: Home / Self Care | Payer: BLUE CROSS/BLUE SHIELD | Attending: Family Medicine | Admitting: Family Medicine

## 2018-01-14 DIAGNOSIS — N390 Urinary tract infection, site not specified: Secondary | ICD-10-CM | POA: Diagnosis not present

## 2018-01-14 DIAGNOSIS — R319 Hematuria, unspecified: Secondary | ICD-10-CM

## 2018-01-14 DIAGNOSIS — H60391 Other infective otitis externa, right ear: Secondary | ICD-10-CM | POA: Diagnosis not present

## 2018-01-14 LAB — URINALYSIS, COMPLETE (UACMP) WITH MICROSCOPIC
BILIRUBIN URINE: NEGATIVE
GLUCOSE, UA: NEGATIVE mg/dL
Ketones, ur: NEGATIVE mg/dL
NITRITE: NEGATIVE
SPECIFIC GRAVITY, URINE: 1.025 (ref 1.005–1.030)
Squamous Epithelial / LPF: NONE SEEN (ref 0–5)
pH: 6 (ref 5.0–8.0)

## 2018-01-14 LAB — BASIC METABOLIC PANEL
ANION GAP: 9 (ref 5–15)
BUN: 13 mg/dL (ref 6–20)
CHLORIDE: 104 mmol/L (ref 101–111)
CO2: 26 mmol/L (ref 22–32)
Calcium: 8.6 mg/dL — ABNORMAL LOW (ref 8.9–10.3)
Creatinine, Ser: 1.05 mg/dL (ref 0.61–1.24)
GFR calc non Af Amer: >60 mL/min (ref >60)
Glucose, Bld: 106 mg/dL — ABNORMAL HIGH (ref 65–99)
Potassium: 3.9 mmol/L (ref 3.5–5.1)
SODIUM: 139 mmol/L (ref 135–145)

## 2018-01-14 MED ORDER — CIPROFLOXACIN HCL 500 MG PO TABS: 500.0000 mg | ORAL_TABLET | Freq: Two times a day (BID) | ORAL | 0 refills | Status: DC

## 2018-01-14 MED ORDER — NEOMYCIN-POLYMYXIN-HC 3.5-10000-1 OT SUSP: 4.0000 [drp] | Freq: Three times a day (TID) | OTIC | 0 refills | Status: DC

## 2018-01-14 NOTE — ED Provider Notes (Signed)
[facility]    CSN: [id number] Arrival date & time: 01/14/18  1640     History   Chief Complaint Chief Complaint  Patient presents with  . Headache    HPI Johnathan Madden. is a 57 y.o. male.   57 yo male with a c/o bodyaches, feeling feverish, fatigued and headache since 10am this morning. Also c/o 2 days of frequent urination. Denies any chest pain, cough, shortness of breath, vomiting, diarrhea, dysuria, hematuria.   The history is provided by the patient.    Past Medical History:  Diagnosis Date  . Pulmonary emboli ([place])   . Shortness of breath dyspnea    with activity    Patient Active Problem List   Diagnosis Date Noted  . History of pulmonary embolus (PE) 01/01/2016  . Carpal tunnel syndrome 07/16/2015  . Fatigue 07/16/2015  . Fatty infiltration of liver 07/16/2015  . HLD (hyperlipidemia) 07/16/2015  . 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 BILATERAL CARPAL TUNNEL RELEASE    . FLEXIBLE BRONCHOSCOPY Right 08/30/2015   Procedure: FLEXIBLE BRONCHOSCOPY;  Surgeon: Wilhelmina Mcardle, MD;  Location: [facility] ORS;  Service: Pulmonary;  Laterality: Right;  . FLEXIBLE BRONCHOSCOPY N/A 09/06/2015   Procedure: FLEXIBLE BRONCHOSCOPY;  Surgeon: Wilhelmina Mcardle, MD;  Location: [facility] OR[facility];  Service: Pulmonary;  Laterality: N/A;       Home Medications    Prior to Admission medications   Medication Sig Start Date End Date Taking? Authorizing Provider  aspirin 81 MG tablet Take 81 mg by mouth daily. Patient not taking at present   Yes [provider]  tamsulosin (FLOMAX) 0.4 MG CAPS capsule Take 1 capsule (0.4 mg total) by mouth at bedtime. 12/31/17  Yes Glean Hess, MD  ciprofloxacin (CIPRO) 500 MG tablet Take 1 tablet (500 mg total) by mouth every 12 (twelve) hours. 01/14/18   Norval Gable, MD  Na Sulfate-K Sulfate-Mg Sulf 17.5-3.13-1.6 GM/177ML SOLN Take 1  kit by mouth once for 1 dose. 01/24/18 01/24/18  Lucilla Lame, MD  neomycin-polymyxin-hydrocortisone (CORTISPORIN) 3.5-10000-1 OTIC suspension Place 4 drops into the right ear 3 (three) times daily. 01/14/18   Norval Gable, MD    Family History Family History  Problem Relation Age of Onset  . CAD Father   . Prostate cancer Father   . Melanoma Mother     Social History Social History   Tobacco Use  . Smoking status: Former Smoker    Packs/day: 1.00    Years: 30.00    Pack years: 30.00    Types: Cigarettes    Last attempt to quit: 08/25/2015    Years since quitting: 2.3  . Smokeless tobacco: Former Systems developer    Quit date: 08/18/2015  Substance Use Topics  . Alcohol use: No    Alcohol/week: 2.4 oz    Types: 4 Standard drinks or equivalent per week  . Drug use: No     Allergies   Patient has no known allergies.   Review of Systems Review of Systems   Physical Exam Triage Vital Signs ED Triage Vitals  Enc Vitals Group     BP 01/14/18 1704 (!) 144/88     Pulse Rate 01/14/18 1704 (!) 114     Resp 01/14/18 1704 18     Temp 01/14/18 1704 99.8 F (37.7 C)     Temp Source 01/14/18 1704 Oral     SpO2 01/14/18 1704 96 %  Weight --      Height --      Head Circumference --      Peak Flow --      Pain Score 01/14/18 1706 7     Pain Loc --      Pain Edu? --      Excl. in [place]? --    No data found.  Updated Vital Signs BP (!) 144/88 (BP Location: Left Arm)   Pulse (!) 114   Temp 99.8 F (37.7 C) (Oral)   Resp 18   SpO2 96%   Visual Acuity Right Eye Distance:   Left Eye Distance:   Bilateral Distance:    Right Eye Near:   Left Eye Near:    Bilateral Near:     Physical Exam  Constitutional: He is oriented to person, place, and time. He appears well-developed and well-nourished.  Non-toxic appearance. He does not appear ill. No distress.  HENT:  Head: Normocephalic and atraumatic.  Right Ear: External ear normal.  Left Ear: Tympanic membrane, external ear and  ear canal normal.  Nose: Nose normal.  Mouth/Throat: Uvula is midline, oropharynx is clear and moist and mucous membranes are normal. No oropharyngeal exudate or tonsillar abscesses.  Right ear canal swollen, erythematous and with drainage  Eyes: Pupils are equal, round, and reactive to light. Conjunctivae and EOM are normal. Right eye exhibits no discharge. Left eye exhibits no discharge. No scleral icterus.  Neck: Normal range of motion. Neck supple. No tracheal deviation present. No thyromegaly present.  Cardiovascular: Normal rate, regular rhythm and normal heart sounds.  Pulmonary/Chest: Effort normal and breath sounds normal. No stridor. No respiratory distress. He has no wheezes. He has no rales. He exhibits no tenderness.  Lymphadenopathy:    He has no cervical adenopathy.  Neurological: He is alert and oriented to person, place, and time. He displays normal reflexes. No cranial nerve deficit or sensory deficit. He exhibits normal muscle tone. Coordination normal.  Skin: Skin is warm and dry. No rash noted. He is not diaphoretic.  Nursing note and vitals reviewed.    UC Treatments / Results  Labs (all labs ordered are listed, but only abnormal results are displayed) Labs Reviewed  BASIC METABOLIC PANEL - Abnormal; Notable for the following components:      Result Value   Glucose, Bld 106 (*)    Calcium 8.6 (*)    All other components within normal limits  URINALYSIS, COMPLETE (UACMP) WITH MICROSCOPIC - Abnormal; Notable for the following components:   APPearance HAZY (*)    Hgb urine dipstick SMALL (*)    Protein, ur >300 (*)    Leukocytes, UA SMALL (*)    Bacteria, UA FEW (*)    All other components within normal limits  URINE CULTURE    EKG None  Radiology No results found.  Procedures Procedures (including critical care time)  Medications Ordered in UC Medications - No data to display  Initial Impression / Assessment and Plan / UC Course  I have reviewed the  triage vital signs and the nursing notes.  Pertinent labs & imaging results that were available during my care of the patient were reviewed by me and considered in my medical decision making (see chart for details).      Final Clinical Impressions(s) / UC Diagnoses   Final diagnoses:  Urinary tract infection with hematuria, site unspecified  Infective otitis externa of right ear    ED Prescriptions    Medication Sig Dispense Auth.  Provider   neomycin-polymyxin-hydrocortisone (CORTISPORIN) 3.5-10000-1 OTIC suspension Place 4 drops into the right ear 3 (three) times daily. 10 mL Norval Gable, MD   ciprofloxacin (CIPRO) 500 MG tablet Take 1 tablet (500 mg total) by mouth every 12 (twelve) hours. 14 tablet Norval Gable, MD     1. Lab results and diagnosis reviewed with patient 2. rx as per orders above; reviewed possible side effects, interactions, risks and benefits  3. Recommend supportive treatment with rest ,fluids, otc analgesics prn 4. Follow-up prn if symptoms worsen or don't improve  Controlled Substance Prescriptions [place] Controlled Substance Registry consulted? Not Applicable   Norval Gable, MD 01/14/18 [phone]

## 2018-01-14 NOTE — ED Triage Notes (Signed)
Pt states he has had a headache since 10am, feeling warm and weakness in his legs. Reports his legs feel "numb." Did take Advil without relief.

## 2018-01-17 LAB — URINE CULTURE
Culture: 80000 — AB
Special Requests: NORMAL

## 2018-01-27 NOTE — Discharge Instructions (Signed)
General Anesthesia, Adult, Care After °These instructions provide you with information about caring for yourself after your procedure. Your health care provider may also give you more specific instructions. Your treatment has been planned according to current medical practices, but problems sometimes occur. Call your health care provider if you have any problems or questions after your procedure. °What can I expect after the procedure? °After the procedure, it is common to have: °· Vomiting. °· A sore throat. °· Mental slowness. ° °It is common to feel: °· Nauseous. °· Cold or shivery. °· Sleepy. °· Tired. °· Sore or achy, even in parts of your body where you did not have surgery. ° °Follow these instructions at home: °For at least 24 hours after the procedure: °· Do not: °? Participate in activities where you could fall or become injured. °? Drive. °? Use heavy machinery. °? Drink alcohol. °? Take sleeping pills or medicines that cause drowsiness. °? Make important decisions or sign legal documents. °? Take care of children on your own. °· Rest. °Eating and drinking °· If you vomit, drink water, juice, or soup when you can drink without vomiting. °· Drink enough fluid to keep your urine clear or pale yellow. °· Make sure you have little or no nausea before eating solid foods. °· Follow the diet recommended by your health care provider. °General instructions °· Have a responsible adult stay with you until you are awake and alert. °· Return to your normal activities as told by your health care provider. Ask your health care provider what activities are safe for you. °· Take over-the-counter and prescription medicines only as told by your health care provider. °· If you smoke, do not smoke without supervision. °· Keep all follow-up visits as told by your health care provider. This is important. °Contact a health care provider if: °· You continue to have nausea or vomiting at home, and medicines are not helpful. °· You  cannot drink fluids or start eating again. °· You cannot urinate after 8-12 hours. °· You develop a skin rash. °· You have fever. °· You have increasing redness at the site of your procedure. °Get help right away if: °· You have difficulty breathing. °· You have chest pain. °· You have unexpected bleeding. °· You feel that you are having a life-threatening or urgent problem. °This information is not intended to replace advice given to you by your health care provider. Make sure you discuss any questions you have with your health care provider. °Document Released: 12/07/2000 Document Revised: 02/03/2016 Document Reviewed: 08/15/2015 °Elsevier Interactive Patient Education © 2018 [employer] ° °

## 2018-01-28 ENCOUNTER — Ambulatory Visit: Payer: BLUE CROSS/BLUE SHIELD | Admitting: Anesthesiology

## 2018-01-28 ENCOUNTER — Encounter
Admission: RE | Disposition: A | Discharge status: Home / Self Care | Payer: Self-pay | Source: Ambulatory Visit | Attending: Gastroenterology

## 2018-01-28 ENCOUNTER — Ambulatory Visit
Admission: RE | Admit: 2018-01-28 | Discharge: 2018-01-28 | Disposition: A | Discharge status: Home / Self Care | Payer: BLUE CROSS/BLUE SHIELD | Source: Ambulatory Visit | Attending: Gastroenterology | Admitting: Gastroenterology

## 2018-01-28 DIAGNOSIS — Z7982 Long term (current) use of aspirin: Secondary | ICD-10-CM | POA: Insufficient documentation

## 2018-01-28 DIAGNOSIS — Z8249 Family history of ischemic heart disease and other diseases of the circulatory system: Secondary | ICD-10-CM | POA: Insufficient documentation

## 2018-01-28 DIAGNOSIS — Z87891 Personal history of nicotine dependence: Secondary | ICD-10-CM | POA: Insufficient documentation

## 2018-01-28 DIAGNOSIS — K635 Polyp of colon: Secondary | ICD-10-CM | POA: Diagnosis not present

## 2018-01-28 DIAGNOSIS — Z86711 Personal history of pulmonary embolism: Secondary | ICD-10-CM | POA: Diagnosis not present

## 2018-01-28 DIAGNOSIS — Z1211 Encounter for screening for malignant neoplasm of colon: Secondary | ICD-10-CM | POA: Insufficient documentation

## 2018-01-28 DIAGNOSIS — D125 Benign neoplasm of sigmoid colon: Secondary | ICD-10-CM

## 2018-01-28 HISTORY — PX: COLONOSCOPY WITH PROPOFOL: SHX5780

## 2018-01-28 HISTORY — PX: POLYPECTOMY: SHX5525

## 2018-01-28 SURGERY — COLONOSCOPY WITH PROPOFOL
Anesthesia: General | Wound class: Contaminated

## 2018-01-28 MED ORDER — LACTATED RINGERS IV SOLN
INTRAVENOUS | Status: DC
  Administered 2018-01-28: 09:00:00 via INTRAVENOUS

## 2018-01-28 MED ORDER — SODIUM CHLORIDE 0.9 % IV SOLN: INTRAVENOUS | Status: DC

## 2018-01-28 MED ORDER — LIDOCAINE HCL (CARDIAC) PF 100 MG/5ML IV SOSY
PREFILLED_SYRINGE | INTRAVENOUS | Status: DC | PRN
  Administered 2018-01-28: 30 mg via INTRAVENOUS

## 2018-01-28 MED ORDER — PROPOFOL 10 MG/ML IV BOLUS
INTRAVENOUS | Status: DC | PRN
  Administered 2018-01-28: 100 mg via INTRAVENOUS
  Administered 2018-01-28 (×2): 20 mg via INTRAVENOUS
  Administered 2018-01-28 (×2): 50 mg via INTRAVENOUS

## 2018-01-28 SURGICAL SUPPLY — 24 items
CANISTER SUCT 1200ML W/VALVE (MISCELLANEOUS) ×4 IMPLANT
CLIP HMST 235XBRD CATH ROT (MISCELLANEOUS) IMPLANT
CLIP RESOLUTION 360 11X235 (MISCELLANEOUS)
ELECT REM PT RETURN 9FT ADLT (ELECTROSURGICAL)
ELECTRODE REM PT RTRN 9FT ADLT (ELECTROSURGICAL) IMPLANT
FCP ESCP3.2XJMB 240X2.8X (MISCELLANEOUS)
FORCEPS BIOP RAD 4 LRG CAP 4 (CUTTING FORCEPS) IMPLANT
FORCEPS BIOP RJ4 240 W/NDL (MISCELLANEOUS)
FORCEPS ESCP3.2XJMB 240X2.8X (MISCELLANEOUS) IMPLANT
GOWN CVR UNV OPN BCK APRN NK (MISCELLANEOUS) ×4 IMPLANT
GOWN ISOL THUMB LOOP REG UNIV (MISCELLANEOUS) ×4
INJECTOR VARIJECT VIN23 (MISCELLANEOUS) IMPLANT
KIT DEFENDO VALVE AND CONN (KITS) IMPLANT
KIT ENDO PROCEDURE OLY (KITS) ×4 IMPLANT
MARKER SPOT ENDO TATTOO 5ML (MISCELLANEOUS) IMPLANT
PROBE APC STR FIRE (PROBE) IMPLANT
RETRIEVER NET ROTH 2.5X230 LF (MISCELLANEOUS) IMPLANT
SNARE SHORT THROW 13M SML OVAL (MISCELLANEOUS) ×4 IMPLANT
SNARE SHORT THROW 30M LRG OVAL (MISCELLANEOUS) IMPLANT
SNARE SNG USE RND 15MM (INSTRUMENTS) IMPLANT
SPOT EX ENDOSCOPIC TATTOO (MISCELLANEOUS)
TRAP ETRAP POLY (MISCELLANEOUS) ×4 IMPLANT
VARIJECT INJECTOR VIN23 (MISCELLANEOUS)
WATER STERILE IRR 250ML POUR (IV SOLUTION) ×4 IMPLANT

## 2018-01-28 NOTE — Anesthesia Preprocedure Evaluation (Signed)
Anesthesia Evaluation  Patient identified by MRN, date of birth, ID band Patient awake    Reviewed: Allergy & Precautions, H&P , NPO status , Patient's Chart, lab work & pertinent test results  Airway Mallampati: III  TM Distance: >3 FB Neck ROM: full    Dental no notable dental hx.    Pulmonary former smoker,    Pulmonary exam normal breath sounds clear to auscultation       Cardiovascular Normal cardiovascular exam Rhythm:regular Rate:Normal     Neuro/Psych    GI/Hepatic   Endo/Other    Renal/GU      Musculoskeletal   Abdominal   Peds  Hematology   Anesthesia Other Findings   Reproductive/Obstetrics                             Anesthesia Physical Anesthesia Plan  ASA: II  Anesthesia Plan: General   Post-op Pain Management:    Induction: Intravenous  PONV Risk Score and Plan: 2 and Propofol infusion and Treatment may vary due to age or medical condition  Airway Management Planned: Natural Airway  Additional Equipment:   Intra-op Plan:   Post-operative Plan:   Informed Consent: I have reviewed the patients History and Physical, chart, labs and discussed the procedure including the risks, benefits and alternatives for the proposed anesthesia with the patient or authorized representative who has indicated his/her understanding and acceptance.     Plan Discussed with: CRNA  Anesthesia Plan Comments:         Anesthesia Quick Evaluation

## 2018-01-28 NOTE — Anesthesia Postprocedure Evaluation (Signed)
Anesthesia Post Note  Patient: Johnathan Madden.  Procedure(s) Performed: COLONOSCOPY WITH PROPOFOL (N/A ) POLYPECTOMY  Patient location during evaluation: PACU Anesthesia Type: General Level of consciousness: awake and alert and oriented Pain management: satisfactory to patient Vital Signs Assessment: post-procedure vital signs reviewed and stable Respiratory status: spontaneous breathing, nonlabored ventilation and respiratory function stable Cardiovascular status: blood pressure returned to baseline and stable Postop Assessment: Adequate PO intake and No signs of nausea or vomiting Anesthetic complications: no    Raliegh Ip

## 2018-01-28 NOTE — H&P (Signed)
Johnathan Lame, MD [facility] [address] [place], [place] [address] Phone: [phone] Fax : [phone]  Primary Care Physician:  Glean Hess, MD Primary Gastroenterologist:  Dr. Allen Norris  Pre-Procedure History & Physical: HPI:  Johnathan Madden. is a 57 y.o. male is here for a screening colonoscopy.   Past Medical History:  Diagnosis Date  . Pulmonary emboli ([place])   . Shortness of breath dyspnea    with activity    Past Surgical History:  Procedure Laterality Date  . BILATERAL CARPAL TUNNEL RELEASE    . FLEXIBLE BRONCHOSCOPY Right 08/30/2015   Procedure: FLEXIBLE BRONCHOSCOPY;  Surgeon: Wilhelmina Mcardle, MD;  Location: [facility];  Service: Pulmonary;  Laterality: Right;  . FLEXIBLE BRONCHOSCOPY N/A 09/06/2015   Procedure: FLEXIBLE BRONCHOSCOPY;  Surgeon: Wilhelmina Mcardle, MD;  Location: [facility];  Service: Pulmonary;  Laterality: N/A;    Prior to Admission medications   Medication Sig Start Date End Date Taking? Authorizing Provider  aspirin 81 MG tablet Take 81 mg by mouth daily. Patient not taking at present   Yes [provider]  Multiple Vitamins-Minerals (MULTIVITAMIN GUMMIES ADULT PO) Take by mouth.   Yes [provider]  tamsulosin (FLOMAX) 0.4 MG CAPS capsule Take 1 capsule (0.4 mg total) by mouth at bedtime. 12/31/17  Yes Glean Hess, MD  ciprofloxacin (CIPRO) 500 MG tablet Take 1 tablet (500 mg total) by mouth every 12 (twelve) hours. Patient not taking: Reported on 01/28/2018 01/14/18   Norval Gable, MD  neomycin-polymyxin-hydrocortisone (CORTISPORIN) 3.5-10000-1 OTIC suspension Place 4 drops into the right ear 3 (three) times daily. Patient not taking: Reported on 01/21/2018 01/14/18   Norval Gable, MD    Allergies as of 01/12/2018  . (No Known Allergies)    Family History  Problem Relation Age of Onset  . CAD Father   . Prostate cancer Father   . Melanoma Mother     Social History   Socioeconomic History  . Marital  status: Single    Spouse name: Not on file  . Number of children: Not on file  . Years of education: Not on file  . Highest education level: Not on file  Occupational History  . Not on file  Social Needs  . Financial resource strain: Not on file  . Food insecurity:    Worry: Not on file    Inability: Not on file  . Transportation needs:    Medical: Not on file    Non-medical: Not on file  Tobacco Use  . Smoking status: Former Smoker    Packs/day: 1.00    Years: 30.00    Pack years: 30.00    Types: Cigarettes    Last attempt to quit: 08/25/2015    Years since quitting: 2.4  . Smokeless tobacco: Former Systems developer    Quit date: 08/18/2015  Substance and Sexual Activity  . Alcohol use: No    Alcohol/week: 2.4 oz    Types: 4 Standard drinks or equivalent per week  . Drug use: No  . Sexual activity: Not on file  Lifestyle  . Physical activity:    Days per week: Not on file    Minutes per session: Not on file  . Stress: Not on file  Relationships  . Social connections:    Talks on phone: Not on file    Gets together: Not on file    Attends religious service: Not on file    Active member of club or organization: Not on file  Attends meetings of clubs or organizations: Not on file    Relationship status: Not on file  . Intimate partner violence:    Fear of current or ex partner: Not on file    Emotionally abused: Not on file    Physically abused: Not on file    Forced sexual activity: Not on file  Other Topics Concern  . Not on file  Social History Narrative  . Not on file    Review of Systems: See HPI, otherwise negative ROS  Physical Exam: BP 114/82   Pulse 82   Temp (!) 97 F (36.1 C) (Temporal)   Resp 16   Ht 5\' 11"  (1.803 m)   Wt 263 lb (119.3 kg)   SpO2 96%   BMI 36.68 kg/m  General:   Alert,  pleasant and cooperative in NAD Head:  Normocephalic and atraumatic. Neck:  Supple; no masses or thyromegaly. Lungs:  Clear throughout to auscultation.    Heart:   Regular rate and rhythm. Abdomen:  Soft, nontender and nondistended. Normal bowel sounds, without guarding, and without rebound.   Neurologic:  Alert and  oriented x4;  grossly normal neurologically.  Impression/Plan: Johnathan Madden. is now here to undergo a screening colonoscopy.  Risks, benefits, and alternatives regarding colonoscopy have been reviewed with the patient.  Questions have been answered.  All parties agreeable.

## 2018-01-28 NOTE — Transfer of Care (Signed)
Immediate Anesthesia Transfer of Care Note  Patient: Johnathan Madden.  Procedure(s) Performed: COLONOSCOPY WITH PROPOFOL (N/A ) POLYPECTOMY  Patient Location: PACU  Anesthesia Type: General  Level of Consciousness: awake, alert  and patient cooperative  Airway and Oxygen Therapy: Patient Spontanous Breathing and Patient connected to supplemental oxygen  Post-op Assessment: Post-op Vital signs reviewed, Patient's Cardiovascular Status Stable, Respiratory Function Stable, Patent Airway and No signs of Nausea or vomiting  Post-op Vital Signs: Reviewed and stable  Complications: No apparent anesthesia complications

## 2018-01-28 NOTE — Anesthesia Procedure Notes (Signed)
Performed by: Tameya Kuznia, CRNA Pre-anesthesia Checklist: Patient identified, Emergency Drugs available, Suction available, Timeout performed and Patient being monitored Patient Re-evaluated:Patient Re-evaluated prior to induction Oxygen Delivery Method: Nasal cannula Placement Confirmation: positive ETCO2       

## 2018-01-28 NOTE — Op Note (Signed)
[facility] Gastroenterology Patient Name: Johnathan Madden Procedure Date: 01/28/2018 9:42 AM MRN: [id number] Account #: [id number] Date of Birth: 1961/04/28 Admit Type: Outpatient Age: 57 Room: [facility] OR ROOM 01 Gender: Male Note Status: Finalized Procedure:            Colonoscopy Indications:          Screening for colorectal malignant neoplasm Providers:            Lucilla Lame MD, MD Referring MD:         [name], MD (Referring MD) Medicines:            Propofol per Anesthesia Complications:        No immediate complications. Procedure:            Pre-Anesthesia Assessment:                       - Prior to the procedure, a History and Physical was                        performed, and patient medications and allergies were                        reviewed. The patient's tolerance of previous                        anesthesia was also reviewed. The risks and benefits of                        the procedure and the sedation options and risks were                        discussed with the patient. All questions were                        answered, and informed consent was obtained. Prior                        Anticoagulants: The patient has taken no previous                        anticoagulant or antiplatelet agents. ASA Grade                        Assessment: II - A patient with mild systemic disease.                        After reviewing the risks and benefits, the patient was                        deemed in satisfactory condition to undergo the                        procedure.                       After obtaining informed consent, the colonoscope was                        passed under direct vision. Throughout the procedure,  the patient's blood pressure, pulse, and oxygen                        saturations were monitored continuously. The [place] [id number]) was introduced through the                        anus and advanced to the the cecum, identified by                        appendiceal orifice and ileocecal valve. The                        colonoscopy was performed without difficulty. The                        patient tolerated the procedure well. The quality of                        the bowel preparation was excellent. Findings:      The perianal and digital rectal examinations were normal.      Three sessile polyps were found in the sigmoid colon. The polyps were 2       to 3 mm in size. These polyps were removed with a cold snare. Resection       and retrieval were complete. Impression:           - Three 2 to 3 mm polyps in the sigmoid colon, removed                        with a cold snare. Resected and retrieved. Recommendation:       - Discharge patient to home.                       - Resume previous diet.                       - Continue present medications. Procedure Code(s):    --- Professional ---                       [phone], Colonoscopy, flexible; with removal of tumor(s),                        polyp(s), or other lesion(s) by snare technique Diagnosis Code(s):    --- Professional ---                       Z12.11, Encounter for screening for malignant neoplasm                        of colon CPT copyright 2017 American Medical Association. All rights reserved. The codes documented in this report are preliminary and upon coder review may  be revised to meet current compliance requirements. Lucilla Lame MD, MD 01/28/2018 10:08:30 AM This report has been signed electronically. Number of Addenda: 0 Note Initiated On: 01/28/2018 9:42 AM Scope Withdrawal Time: 0 hours 9 minutes 27 seconds  Total Procedure Duration: 0 hours 11 minutes 11 seconds  [facility]

## 2018-01-31 ENCOUNTER — Encounter: Payer: Self-pay | Admitting: Gastroenterology

## 2018-02-01 ENCOUNTER — Encounter: Payer: Self-pay | Admitting: Gastroenterology

## 2018-05-19 ENCOUNTER — Ambulatory Visit: Payer: BLUE CROSS/BLUE SHIELD | Admitting: Internal Medicine

## 2018-05-19 ENCOUNTER — Encounter: Payer: Self-pay | Admitting: Internal Medicine

## 2018-05-19 VITALS — BP 114/78 | HR 89 | Ht 71.0 in | Wt 263.0 lb

## 2018-05-19 DIAGNOSIS — R21 Rash and other nonspecific skin eruption: Secondary | ICD-10-CM | POA: Diagnosis not present

## 2018-05-19 MED ORDER — PREDNISONE 10 MG PO TABS: ORAL_TABLET | ORAL | 0 refills | Status: AC

## 2018-05-19 NOTE — Progress Notes (Signed)
Date:  05/19/2018   Name:  Johnathan Madden.   DOB:  [birth year]   MRN:  [id number]   Chief Complaint: Rash (Right leg rash, itchy. Spreading. Started yesterday. Feels like it "warms up when irritated".  Went out in the woods to visit a dogs grave 2 days ago.)  Rash  This is a new problem. The current episode started yesterday. The problem has been rapidly worsening since onset. Pertinent negatives include no cough, diarrhea, fatigue, fever or shortness of breath.  He walked through some high grass and weeds with burrs at an old house to visit a dog's grave yesterday.  The rash started right after that and has spread up his right leg.  The bumps are very itchy, he has a few similar on his left leg but no other location. He has been using topical itch relief spray with some benefit.   Review of Systems  Constitutional: Negative for chills, fatigue and fever.  Respiratory: Negative for cough, chest tightness, shortness of breath and wheezing.   Cardiovascular: Negative for chest pain and palpitations.  Gastrointestinal: Negative for abdominal pain, constipation and diarrhea.  Skin: Positive for rash.  Allergic/Immunologic: Negative for environmental allergies.  Neurological: Negative for dizziness and headaches.    Patient Active Problem List   Diagnosis Date Noted  . Encounter for screening colonoscopy   . Polyp of sigmoid colon   . History of pulmonary embolus (PE) 01/01/2016  . Carpal tunnel syndrome 07/16/2015  . Fatigue 07/16/2015  . Fatty infiltration of liver 07/16/2015  . HLD (hyperlipidemia) 07/16/2015  . 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 Compulsive tobacco user syndrome 07/16/2015    No Known Allergies  Past Surgical History:  Procedure Laterality Date  . BILATERAL CARPAL TUNNEL RELEASE    . COLONOSCOPY WITH PROPOFOL N/A 01/28/2018   Procedure: COLONOSCOPY WITH PROPOFOL;  Surgeon: Lucilla Lame, MD;  Location: [place];  Service: Endoscopy;  Laterality: N/A;  .  FLEXIBLE BRONCHOSCOPY Right 08/30/2015   Procedure: FLEXIBLE BRONCHOSCOPY;  Surgeon: Wilhelmina Mcardle, MD;  Location: [facility];  Service: Pulmonary;  Laterality: Right;  . FLEXIBLE BRONCHOSCOPY N/A 09/06/2015   Procedure: FLEXIBLE BRONCHOSCOPY;  Surgeon: Wilhelmina Mcardle, MD;  Location: [facility];  Service: Pulmonary;  Laterality: N/A;  . POLYPECTOMY  01/28/2018   Procedure: POLYPECTOMY;  Surgeon: Lucilla Lame, MD;  Location[facility];  Service: Endoscopy;;    Social History   Tobacco Use  . Smoking status: Former Smoker    Packs/day: 1.00    Years: 30.00    Pack years: 30.00    Types: Cigarettes    Last attempt to quit: 08/25/2015    Years since quitting: 2.7  . Smokeless tobacco: Former Systems developer    Quit date: 08/18/2015  Substance Use Topics  . Alcohol use: No    Alcohol/week: 4.0 standard drinks    Types: 4 Standard drinks or equivalent per week  . Drug use: No     Medication list has been reviewed and updated.  Current Meds  Medication Sig  . aspirin 81 MG tablet Take 81 mg by mouth daily. Patient not taking at present  . Multiple Vitamins-Minerals (MULTIVITAMIN GUMMIES ADULT PO) Take by mouth.  . neomycin-polymyxin-hydrocortisone (CORTISPORIN) 3.5-10000-1 OTIC suspension Place 4 drops into the right ear 3 (three) times daily.  . tamsulosin (FLOMAX) 0.4 MG CAPS capsule Take 1 capsule (0.4 mg total) by mouth at bedtime.    PHQ 2/9 Scores 06/02/2017  PHQ - 2 Score 0  Physical Exam  Constitutional: He is oriented to person, place, and time. He appears well-developed. No distress.  HENT:  Head: Normocephalic and atraumatic.  Pulmonary/Chest: Effort normal. No respiratory distress.  Musculoskeletal: Normal range of motion.  Neurological: He is alert and oriented to person, place, and time.  Skin: Skin is warm and dry. No rash noted.  Multiple 2 mm red, raised papules on right leg and thigh in a random distribution Few similar lesion on left leg   Psychiatric: He  has a normal mood and affect. His behavior is normal. Thought content normal.  Nursing note and vitals reviewed.   BP 114/78 (BP Location: Right Arm, Patient Position: Sitting, Cuff Size: Large)   Pulse 89   Ht 5\' 11"  (1.803 m)   Wt 263 lb (119.3 kg)   SpO2 96%   BMI 36.68 kg/m   Assessment and Plan: 1. Rash Probably insect bites Continue topical spray for itching - predniSONE (DELTASONE) 10 MG tablet; Take 6 on day 1, 5 on day 2, 4 on day 3, 3 on day 4, 2 on day 5 and 1 on day 1 then stop.  Dispense: 21 tablet; Refill: 0   Meds ordered this encounter  Medications  . predniSONE (DELTASONE) 10 MG tablet    Sig: Take 6 on day 1, 5 on day 2, 4 on day 3, 3 on day 4, 2 on day 5 and 1 on day 1 then stop.    Dispense:  21 tablet    Refill:  0    Partially dictated using Editor, commissioning. Any errors are unintentional.  [name], MD [facility]  05/19/2018   There are no diagnoses linked to this encounter.

## 2018-10-22 ENCOUNTER — Emergency Department
Admission: EM | Admit: 2018-10-22 | Discharge: 2018-10-22 | Disposition: A | Discharge status: Home / Self Care | Payer: BLUE CROSS/BLUE SHIELD | Attending: Emergency Medicine | Admitting: Emergency Medicine

## 2018-10-22 ENCOUNTER — Emergency Department: Payer: BLUE CROSS/BLUE SHIELD

## 2018-10-22 ENCOUNTER — Other Ambulatory Visit: Payer: Self-pay

## 2018-10-22 ENCOUNTER — Encounter: Payer: Self-pay | Admitting: Emergency Medicine

## 2018-10-22 DIAGNOSIS — Z87891 Personal history of nicotine dependence: Secondary | ICD-10-CM | POA: Insufficient documentation

## 2018-10-22 DIAGNOSIS — M545 Low back pain, unspecified: Secondary | ICD-10-CM

## 2018-10-22 DIAGNOSIS — Z79899 Other long term (current) drug therapy: Secondary | ICD-10-CM | POA: Insufficient documentation

## 2018-10-22 MED ORDER — NAPROXEN 500 MG PO TABS: 500.0000 mg | ORAL_TABLET | Freq: Two times a day (BID) | ORAL | 0 refills | Status: DC

## 2018-10-22 MED ORDER — CYCLOBENZAPRINE HCL 5 MG PO TABS: ORAL_TABLET | ORAL | 0 refills | Status: DC
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
## 2018-10-25 ENCOUNTER — Ambulatory Visit
Admission: EM | Admit: 2018-10-25 | Discharge: 2018-10-25 | Disposition: A | Discharge status: Home / Self Care | Payer: BLUE CROSS/BLUE SHIELD | Attending: Family Medicine | Admitting: Family Medicine

## 2018-10-25 ENCOUNTER — Encounter: Payer: Self-pay | Admitting: Emergency Medicine

## 2018-10-25 ENCOUNTER — Other Ambulatory Visit: Payer: Self-pay

## 2018-10-25 DIAGNOSIS — M545 Low back pain, unspecified: Secondary | ICD-10-CM
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
## 2018-10-25 NOTE — Discharge Instructions (Addendum)
Take medication as previously prescribed. Rest. Drink plenty of fluids. Stretch.   Follow up with your primary care physician this week as needed. Return to Urgent care for new or worsening concerns.
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
## 2018-11-11 ENCOUNTER — Encounter: Payer: Self-pay | Admitting: Internal Medicine

## 2018-11-11 ENCOUNTER — Ambulatory Visit (INDEPENDENT_AMBULATORY_CARE_PROVIDER_SITE_OTHER): Payer: Self-pay | Admitting: Internal Medicine

## 2018-11-11 ENCOUNTER — Other Ambulatory Visit: Payer: Self-pay

## 2018-11-11 VITALS — BP 132/82 | HR 88 | Ht 71.0 in | Wt 291.0 lb

## 2018-11-11 DIAGNOSIS — M545 Low back pain, unspecified: Secondary | ICD-10-CM

## 2018-11-11 MED ORDER — NAPROXEN 500 MG PO TABS: 500.0000 mg | ORAL_TABLET | Freq: Two times a day (BID) | ORAL | 1 refills | Status: DC

## 2018-11-11 MED ORDER — CYCLOBENZAPRINE HCL 5 MG PO TABS: 5.0000 mg | ORAL_TABLET | Freq: Every day | ORAL | 1 refills | Status: DC
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
## 2018-11-14 ENCOUNTER — Other Ambulatory Visit: Payer: Self-pay | Admitting: Internal Medicine

## 2018-11-14 DIAGNOSIS — M545 Low back pain, unspecified: Secondary | ICD-10-CM

## 2018-11-14 MED ORDER — CYCLOBENZAPRINE HCL 5 MG PO TABS: 5.0000 mg | ORAL_TABLET | Freq: Every day | ORAL | 1 refills | Status: DC

## 2018-11-28 ENCOUNTER — Other Ambulatory Visit: Payer: Self-pay

## 2018-11-28 ENCOUNTER — Encounter: Payer: Self-pay | Admitting: Internal Medicine

## 2018-11-28 ENCOUNTER — Ambulatory Visit: Payer: BLUE CROSS/BLUE SHIELD | Admitting: Internal Medicine

## 2018-11-28 VITALS — BP 122/78 | HR 98 | Temp 98.1°F | Resp 16 | Ht 71.0 in | Wt 295.0 lb

## 2018-11-28 DIAGNOSIS — J029 Acute pharyngitis, unspecified: Secondary | ICD-10-CM

## 2018-11-28 DIAGNOSIS — J069 Acute upper respiratory infection, unspecified: Secondary | ICD-10-CM

## 2018-11-28 MED ORDER — AZITHROMYCIN 250 MG PO TABS: ORAL_TABLET | ORAL | 0 refills | Status: AC

## 2018-11-28 NOTE — Patient Instructions (Addendum)
Delsym cough syrup if needed  Continue Mucinex

## 2018-11-28 NOTE — Progress Notes (Signed)
Date:  11/28/2018   Name:  Johnathan Madden.   DOB:  11-30-1960   MRN:  [id number]   Chief Complaint: Cough (last thursday started having coughing and sweated Sat all day. ) and GI Problem (abdominal cramping since Sat )  Cough  This is a new problem. The current episode started in the past 7 days. The problem has been gradually improving. The cough is non-productive. Associated symptoms include chills, ear pain, postnasal drip, a sore throat and shortness of breath. Pertinent negatives include no chest pain, fever, headaches or wheezing. The symptoms are aggravated by exercise. He has tried nothing for the symptoms.    Review of Systems  Constitutional: Positive for chills. Negative for appetite change, fever and unexpected weight change.  HENT: Positive for ear pain, postnasal drip and sore throat.   Eyes: Negative for visual disturbance.  Respiratory: Positive for cough and shortness of breath. Negative for chest tightness and wheezing.   Cardiovascular: Negative for chest pain.  Gastrointestinal: Positive for abdominal pain and diarrhea. Negative for vomiting.  Neurological: Negative for dizziness and headaches.  Psychiatric/Behavioral: Negative for sleep disturbance.    Patient Active Problem List   Diagnosis Date Noted  . Obesity, morbid ([place]) 11/11/2018  . Polyp of sigmoid colon   . History of pulmonary embolus (PE) 01/01/2016  . Carpal tunnel syndrome 07/16/2015  . Fatty infiltration of liver 07/16/2015  . HLD (hyperlipidemia) 07/16/2015  . 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 Compulsive tobacco user syndrome 07/16/2015    No Known Allergies  Past Surgical History:  Procedure Laterality Date  . BILATERAL CARPAL TUNNEL RELEASE    . COLONOSCOPY WITH PROPOFOL N/A 01/28/2018   Procedure: COLONOSCOPY WITH PROPOFOL;  Surgeon: Lucilla Lame, MD;  Location: [place];  Service: Endoscopy;  Laterality: N/A;  . FLEXIBLE BRONCHOSCOPY Right 08/30/2015   Procedure: FLEXIBLE  BRONCHOSCOPY;  Surgeon: Wilhelmina Mcardle, MD;  Location: [facility];  Service: Pulmonary;  Laterality: Right;  . FLEXIBLE BRONCHOSCOPY N/A 09/06/2015   Procedure: FLEXIBLE BRONCHOSCOPY;  Surgeon: Wilhelmina Mcardle, MD;  Location: [facility];  Service: Pulmonary;  Laterality: N/A;  . POLYPECTOMY  01/28/2018   Procedure: POLYPECTOMY;  Surgeon: Lucilla Lame, MD;  Location: [facility];  Service: Endoscopy;;    Social History   Tobacco Use  . Smoking status: Former Smoker    Packs/day: 1.00    Years: 30.00    Pack years: 30.00    Types: Cigarettes    Last attempt to quit: 08/25/2015    Years since quitting: 3.2  . Smokeless tobacco: Former Systems developer    Quit date: 08/18/2015  Substance Use Topics  . Alcohol use: No    Alcohol/week: 4.0 standard drinks    Types: 4 Standard drinks or equivalent per week  . Drug use: No     Medication list has been reviewed and updated.  Current Meds  Medication Sig  . aspirin 81 MG tablet Take 81 mg by mouth daily. Patient not taking at present  . cyclobenzaprine (FLEXERIL) 5 MG tablet Take 1 tablet (5 mg total) by mouth at bedtime.  . Multiple Vitamins-Minerals (MULTIVITAMIN GUMMIES ADULT PO) Take by mouth.  . naproxen (NAPROSYN) 500 MG tablet Take 1 tablet (500 mg total) by mouth 2 (two) times daily with a meal.    PHQ 2/9 Scores 11/11/2018 06/02/2017  PHQ - 2 Score 0 0    Physical Exam Vitals signs and nursing note reviewed.  Constitutional:      General: He is  not in acute distress.    Appearance: He is well-developed.  HENT:     Head: Normocephalic and atraumatic.     Right Ear: Ear canal normal. Tympanic membrane is retracted.     Left Ear: Ear canal normal. Tympanic membrane is retracted.     Mouth/Throat:     Mouth: Mucous membranes are moist.     Pharynx: Posterior oropharyngeal erythema and uvula swelling present. No oropharyngeal exudate.  Neck:     Musculoskeletal: Normal range of motion and neck supple.  Cardiovascular:     Rate  and Rhythm: Normal rate and regular rhythm.  Pulmonary:     Effort: Pulmonary effort is normal. No respiratory distress.     Breath sounds: Normal breath sounds. No wheezing, rhonchi or rales.  Musculoskeletal: Normal range of motion.  Skin:    General: Skin is warm and dry.     Findings: No rash.  Neurological:     Mental Status: He is alert and oriented to person, place, and time.  Psychiatric:        Behavior: Behavior normal.        Thought Content: Thought content normal.     Wt Readings from Last 3 Encounters:  11/28/18 295 lb (133.8 kg)  11/11/18 291 lb (132 kg)  10/25/18 262 lb (118.8 kg)    BP 122/78   Pulse 98   Temp 98.1 F (36.7 C) (Oral)   Resp 16   Ht 5\' 11"  (1.803 m)   Wt 295 lb (133.8 kg)   SpO2 95%   BMI 41.14 kg/m   Assessment and Plan: 1. Pharyngitis, unspecified etiology - azithromycin (ZITHROMAX Z-PAK) 250 MG tablet; UAD  Dispense: 6 each; Refill: 0  2. Viral URI No risk factors or symptoms to suggest Inflenza or Covid19 Continue Mucinex   Partially dictated using Editor, commissioning. Any errors are unintentional.  [name], MD [facility]  11/28/2018

## 2019-01-06 ENCOUNTER — Encounter: Payer: BLUE CROSS/BLUE SHIELD | Admitting: Internal Medicine

## 2019-01-12 ENCOUNTER — Other Ambulatory Visit: Payer: Self-pay

## 2019-01-12 ENCOUNTER — Encounter: Payer: Self-pay | Admitting: Internal Medicine

## 2019-01-12 ENCOUNTER — Ambulatory Visit
Admission: RE | Admit: 2019-01-12 | Discharge: 2019-01-12 | Disposition: A | Discharge status: Home / Self Care | Payer: BLUE CROSS/BLUE SHIELD | Attending: Internal Medicine | Admitting: Internal Medicine

## 2019-01-12 ENCOUNTER — Ambulatory Visit
Admission: RE | Admit: 2019-01-12 | Discharge: 2019-01-12 | Disposition: A | Discharge status: Home / Self Care | Payer: BLUE CROSS/BLUE SHIELD | Source: Ambulatory Visit | Attending: Internal Medicine | Admitting: Internal Medicine

## 2019-01-12 ENCOUNTER — Ambulatory Visit: Payer: BLUE CROSS/BLUE SHIELD | Admitting: Internal Medicine

## 2019-01-12 VITALS — BP 132/78 | HR 91 | Ht 71.0 in | Wt 296.0 lb

## 2019-01-12 DIAGNOSIS — M546 Pain in thoracic spine: Secondary | ICD-10-CM

## 2019-01-12 DIAGNOSIS — M5136 Other intervertebral disc degeneration, lumbar region: Secondary | ICD-10-CM | POA: Diagnosis not present
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
## 2019-01-13 ENCOUNTER — Encounter: Payer: Self-pay | Admitting: Internal Medicine

## 2019-01-18 ENCOUNTER — Other Ambulatory Visit: Payer: Self-pay | Admitting: Internal Medicine

## 2019-01-18 ENCOUNTER — Telehealth: Payer: Self-pay

## 2019-01-18 DIAGNOSIS — M546 Pain in thoracic spine: Secondary | ICD-10-CM

## 2019-01-18 DIAGNOSIS — M5136 Other intervertebral disc degeneration, lumbar region: Secondary | ICD-10-CM

## 2019-01-18 NOTE — Telephone Encounter (Signed)
Patient informed. 

## 2019-01-18 NOTE — Telephone Encounter (Signed)
Referral placed.

## 2019-01-18 NOTE — Telephone Encounter (Signed)
Patient called saying he called his insurance and was told that he can go to see a specialist without PT. Wants referral for [employer]. 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   [address] [place], [place] [address]  [place]  Office [phone]  Fax [phone]   Please Advise. Needs referral.

## 2019-02-10 ENCOUNTER — Encounter: Payer: BLUE CROSS/BLUE SHIELD | Admitting: Internal Medicine

## 2019-06-01 ENCOUNTER — Ambulatory Visit
Admission: EM | Admit: 2019-06-01 | Discharge: 2019-06-01 | Disposition: A | Discharge status: Home / Self Care | Payer: BC Managed Care – PPO | Attending: Urgent Care | Admitting: Urgent Care

## 2019-06-01 ENCOUNTER — Ambulatory Visit (INDEPENDENT_AMBULATORY_CARE_PROVIDER_SITE_OTHER): Payer: BC Managed Care – PPO

## 2019-06-01 ENCOUNTER — Other Ambulatory Visit: Payer: Self-pay

## 2019-06-01 DIAGNOSIS — R0602 Shortness of breath: Secondary | ICD-10-CM | POA: Diagnosis not present

## 2019-06-01 DIAGNOSIS — R05 Cough: Secondary | ICD-10-CM

## 2019-06-01 DIAGNOSIS — Z7189 Other specified counseling: Secondary | ICD-10-CM

## 2019-06-01 DIAGNOSIS — B9689 Other specified bacterial agents as the cause of diseases classified elsewhere: Secondary | ICD-10-CM

## 2019-06-01 DIAGNOSIS — J019 Acute sinusitis, unspecified: Secondary | ICD-10-CM | POA: Diagnosis not present

## 2019-06-01 MED ORDER — ACETAMINOPHEN 325 MG PO TABS
650.0000 mg | ORAL_TABLET | Freq: Once | ORAL | Status: AC
  Administered 2019-06-01: 20:00:00 650 mg via ORAL

## 2019-06-01 MED ORDER — AMOXICILLIN-POT CLAVULANATE 875-125 MG PO TABS: 1.0000 | ORAL_TABLET | Freq: Two times a day (BID) | ORAL | 0 refills | Status: DC

## 2019-06-01 MED ORDER — AMOXICILLIN-POT CLAVULANATE 875-125 MG PO TABS: 1.0000 | ORAL_TABLET | Freq: Two times a day (BID) | ORAL | 0 refills | Status: AC

## 2019-06-01 NOTE — ED Triage Notes (Signed)
Patient complains of headache x 3 days. Patient states that he feels like this may be sinus related or possible ear infection.

## 2019-06-01 NOTE — Discharge Instructions (Signed)
It was very nice seeing you today in clinic. Thank you for entrusting me with your care.   Rest and increase hydration. Please utilize the medications that we discussed. Your prescriptions have been called in to your pharmacy. May use Tylenol and/or Ibuprofen as needed for pain/fever.   Make arrangements to follow up with your regular doctor in 1 week for re-evaluation if not improving.  If your symptoms/condition worsens, please seek follow up care either here or in the ER. Please remember, our [place] providers are "right here with you" when you need [place].   Again, it was my pleasure to take care of you today. Thank you for choosing our clinic. I hope that you start to feel better quickly.   Honor Loh, MSN, APRN, FNP-C, CEN Advanced Practice Provider [facility]

## 2019-06-01 NOTE — ED Provider Notes (Signed)
[place], [place]   Name: Johnathan Madden. DOB: [birth year] MRN: [id number] CSN: [id number] PCP: Glean Hess, MD  Arrival date and time:  06/01/19 1838  Chief Complaint:  Headache   NOTE: Prior to seeing the patient today, I have reviewed the triage nursing documentation and vital signs. Clinical staff has updated patient's PMH/PSHx, current medication list, and drug allergies/intolerances to ensure comprehensive history available to assist in medical decision making.   History:   HPI: Johnathan Royster. is a 58 y.o. male who presents today with complaints of 3 day history of a generalized headache. He denies any associated neurological symptoms; no weakness, visual changes, or changes to his speech. Patient states, "it is really bad behind my eyes". Patient has had a non-productive cough and he has some increased shortness of breath; SPO2 93% on RA. Cough has improved with OTC anti-tussives. Patient reports subjective fevers at home. He has been "laying in the bed sick" for the last 3 days. He comes in today with a temperature of 100. Patient notes that is throat is sore "only when he coughs". He complains of BILATERAL ear pain. He has been taking Nyquil, BC powders, cough syrup, and Aleve with minimal relief. Patient denies nausea, vomiting, diarrhea, and abdominal pain. He denies being in close contact with anyone known to be ill. He has never been tested for SARS-CoV-2 (novel coronavirus). Of note, patient reporting that his wife had a retained 3 dose supply of clindamycin, which he started to take yesterday; has taken all 3 doses at this point.   Past Medical History:  Diagnosis Date  . Pulmonary emboli ([place])   . Shortness of breath dyspnea    with activity    Past Surgical History:  Procedure Laterality Date  . BILATERAL CARPAL TUNNEL RELEASE    . COLONOSCOPY WITH PROPOFOL N/A 01/28/2018   Procedure: COLONOSCOPY WITH PROPOFOL;  Surgeon: Lucilla Lame, MD;  Location: [facility];  Service: Endoscopy;  Laterality: N/A;  . FLEXIBLE BRONCHOSCOPY Right 08/30/2015   Procedure: FLEXIBLE BRONCHOSCOPY;  Surgeon: Wilhelmina Mcardle, MD;  Location: [facility];  Service: Pulmonary;  Laterality: Right;  . FLEXIBLE BRONCHOSCOPY N/A 09/06/2015   Procedure: FLEXIBLE BRONCHOSCOPY;  Surgeon: Wilhelmina Mcardle, MD;  Location: [facility];  Service: Pulmonary;  Laterality: N/A;  . POLYPECTOMY  01/28/2018   Procedure: POLYPECTOMY;  Surgeon: Lucilla Lame, MD;  Location: [facility];  Service: Endoscopy;;    Family History  Problem Relation Age of Onset  . CAD Father   . Prostate cancer Father   . Melanoma Mother     Social History   Tobacco Use  . Smoking status: Former Smoker    Packs/day: 1.00    Years: 30.00    Pack years: 30.00    Types: Cigarettes    Quit date: 08/25/2015    Years since quitting: 3.7  . Smokeless tobacco: Former Systems developer    Quit date: 08/18/2015  Substance Use Topics  . Alcohol use: No    Alcohol/week: 4.0 standard drinks    Types: 4 Standard drinks or equivalent per week  . Drug use: No    Patient Active Problem List   Diagnosis Date Noted  . Degenerative disc disease, lumbar 01/12/2019  . Obesity, morbid ([place]) 11/11/2018  . Polyp of sigmoid colon   . History of pulmonary embolus (PE) 01/01/2016  . Carpal tunnel syndrome 07/16/2015  . Fatty infiltration of liver 07/16/2015  . HLD (hyperlipidemia) 07/16/2015  . 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  Compulsive tobacco user syndrome 07/16/2015    Home Medications:    Current Meds  Medication Sig  . aspirin 81 MG tablet Take 81 mg by mouth daily. Patient not taking at present    Allergies:   Patient has no known allergies.  Review of Systems (ROS): Review of Systems  Constitutional: Negative for fatigue and fever.  HENT: Positive for ear pain and sore throat (with cough only). Negative for congestion, postnasal drip, rhinorrhea, sinus pressure, sinus pain and sneezing.   Eyes: Negative  for pain, discharge and redness.  Respiratory: Positive for cough and shortness of breath. Negative for chest tightness.   Cardiovascular: Negative for chest pain and palpitations.  Gastrointestinal: Negative for abdominal pain, diarrhea, nausea and vomiting.  Musculoskeletal: Negative for arthralgias, back pain, myalgias and neck pain.  Skin: Negative for color change, pallor and rash.  Neurological: Positive for headaches. Negative for dizziness, syncope and weakness.  Hematological: Negative for adenopathy.     Vital Signs: Today's Vitals   06/01/19 1902 06/01/19 1904 06/01/19 2003 06/01/19 2003  BP:  123/84    Pulse:  (!) 109  99  Resp:  20  20  Temp:  100 F (37.8 C)  99.9 F (37.7 C)  TempSrc:  Oral  Oral  SpO2:  93%  95%  Weight: 295 lb (133.8 kg)     Height: 5\' 10"  (1.778 m)     PainSc: 3   3      Physical Exam: Physical Exam  Constitutional: He is oriented to person, place, and time and well-developed, well-nourished, and in no distress.  Non-toxic appearance. He has a sickly appearance (acutely ill appearing). No distress.  HENT:  Head: Normocephalic and atraumatic.  Right Ear: Hearing normal. There is tenderness. Tympanic membrane is erythematous. Tympanic membrane is not bulging.  Left Ear: Hearing normal. There is tenderness. Tympanic membrane is injected. Tympanic membrane is not bulging.  Nose: Nose normal.  Mouth/Throat: Uvula is midline and mucous membranes are normal. Posterior oropharyngeal erythema present. No posterior oropharyngeal edema.  Eyes: Pupils are equal, round, and reactive to light. Conjunctivae and EOM are normal.  Neck: Normal range of motion and full passive range of motion without pain. Neck supple. No tracheal deviation present.  Cardiovascular: Regular rhythm, normal heart sounds and intact distal pulses. Tachycardia present. Exam reveals no gallop and no friction rub.  No murmur heard. Pulmonary/Chest: Effort normal. No respiratory  distress. He has decreased breath sounds in the right lower field and the left lower field. He has no wheezes. He has rhonchi (clears somewhat with cough). He has no rales.  Breathing heavily; SPO2 93% on RA  Abdominal: Soft. Bowel sounds are normal. He exhibits no distension. There is no abdominal tenderness.  Musculoskeletal: Normal range of motion.  Lymphadenopathy:       Head (right side): Submandibular adenopathy present.  Neurological: He is alert and oriented to person, place, and time. Gait normal.  Skin: Skin is warm and dry. No rash noted. He is not diaphoretic.  Psychiatric: Mood, memory, affect and judgment normal.  Nursing note and vitals reviewed.   Urgent Care Treatments / Results:   LABS: PLEASE NOTE: all labs that were ordered this encounter are listed, however only abnormal results are displayed. Labs Reviewed  NOVEL CORONAVIRUS, NAA (HOSP ORDER, SEND-OUT TO REF LAB; TAT 18-24 HRS)    EKG: -None  RADIOLOGY: Dg Chest 2 View  Result Date: 06/01/2019 CLINICAL DATA:  Fever, shortness of breath, headache for 3 days  EXAM: CHEST - 2 VIEW COMPARISON:  Radiograph 11/16/2015, CT 04/05/2012 FINDINGS: Stable bandlike area of scarring in the right upper lung. No consolidation, features of edema, pneumothorax, or effusion. Pulmonary vascularity is normally distributed. The cardiomediastinal contours are unremarkable. No acute osseous or soft tissue abnormality. IMPRESSION: Stable right upper lobe scarring. No acute cardiopulmonary abnormality. Electronically Signed   By: Lovena Le M.D.   On: 06/01/2019 19:56    PROCEDURES: Procedures  MEDICATIONS RECEIVED THIS VISIT: Medications  acetaminophen (TYLENOL) tablet 650 mg (650 mg Oral Given by Other 06/01/19 1938)    PERTINENT CLINICAL COURSE NOTES/UPDATES:   Initial Impression / Assessment and Plan / Urgent Care Course:  Pertinent labs & imaging results that were available during my care of the patient were personally  reviewed by me and considered in my medical decision making (see lab/imaging section of note for values and interpretations).  Johnathan Garn Connerly. is a 58 y.o. male who presents to [facility] today with complaints of cough, fever, headache, and otalgia.   Patient acutely ill appearing today in clinic. He does not appear toxic or in any acute distress. Presenting symptoms (see HPI) and exam as documented above. He presents with symptoms associated with SARS-CoV-2 (novel coronavirus). Discussed typical symptom constellation. Reviewed potential for infection and need for testing. Patient amenable to being tested. SARS-CoV-2 swab collected by certified clinical staff. Discussed variable turn around times associated with testing, as swabs are being processed at [facility], and have been taking between 2-5 days to come back. He was advised to self quarantine, per [facility], The DHHS guidelines, until negative results received.   Radiographs of the chest revealed no acute cardiopulmonary process; no evidence of peribronchial thickening, areas of consolidation, or focal infiltrates. Exam concerning for sinusitis with early pulmonary involvement. SPO2 93% on RA. He breathes heavily at baseline, but is noted to become more dyspneic with exertion. Patient has been more sedentary since his symptoms began. In light of symptoms and clinical exam, will proceed with treatment using a 10 day course of Augmentin. Reviewed pulmonary hygiene to prevent atelectasis and development of pneumonia. Discussed supportive care measures at home during acute phase of illness. Patient to rest as much as possible. He was encouraged to ensure adequate hydration (water and ORS) to prevent dehydration and electrolyte derangements. Patient may use APAP and/or IBU on an as needed basis for pain/fever. Patient to return call to the clinic if he is not improving.   Current clinical condition warrants patient being out of work in order to recover from  his current injury/illness. He was provided with the appropriate documentation to provide to his place of employment that will allow for him to RTW on 06/05/2019 with no restrictions.   Discussed follow up with primary care physician in 1 week for re-evaluation. I have reviewed the follow up and strict return precautions for any new or worsening symptoms. Patient is aware of symptoms that would be deemed urgent/emergent, and would thus require further evaluation either here or in the emergency department. At the time of discharge, he verbalized understanding and consent with the discharge plan as it was reviewed with him. All questions were fielded by provider and/or clinic staff prior to patient discharge.    Final Clinical Impressions / Urgent Care Diagnoses:   Final diagnoses:  Acute bacterial rhinosinusitis  Advice Given About Covid-19 Virus Infection    New Prescriptions:  [place] Controlled Substance Registry consulted? Not Applicable  Meds ordered this encounter  Medications  . acetaminophen (  TYLENOL) tablet 650 mg  . amoxicillin-clavulanate (AUGMENTIN) 875-125 MG tablet    Sig: Take 1 tablet by mouth 2 (two) times daily for 10 days.    Dispense:  20 tablet    Refill:  0    Recommended Follow up Care:  Patient encouraged to follow up with the following provider within the specified time frame, or sooner as dictated by the severity of his symptoms. As always, he was instructed that for any urgent/emergent care needs, he should seek care either here or in the emergency department for more immediate evaluation.  Follow-up Information    Glean Hess, MD In 1 week.   Specialty: Internal Medicine Why: General reassessment of symptoms if not improving Contact information: [address] [place] [address] [phone]         NOTE: This note was prepared using Dragon dictation software along with smaller phrase technology. Despite my best ability to proofread,  there is the potential that transcriptional errors may still occur from this process, and are completely unintentional.    Karen Kitchens, NP 06/02/19 0020

## 2019-06-03 LAB — NOVEL CORONAVIRUS, NAA (HOSP ORDER, SEND-OUT TO REF LAB; TAT 18-24 HRS): SARS-CoV-2, NAA: DETECTED — AB

## 2019-06-05 ENCOUNTER — Encounter (HOSPITAL_COMMUNITY): Payer: Self-pay

## 2019-06-05 ENCOUNTER — Telehealth (HOSPITAL_COMMUNITY): Payer: Self-pay | Admitting: Emergency Medicine

## 2019-06-05 NOTE — Telephone Encounter (Signed)
contated patient and made him aware of positive covid. Sending him work notes and CDC guidelines for no retesting.

## 2019-06-05 NOTE — Telephone Encounter (Signed)
Covid POSITIVE. Attempted to reach patient. No answer at this time x2. No voicemail set up. Will send MyChart message

## 2019-06-26 MED ORDER — HEPARIN (PORCINE) IN NACL 25000-0.45 UT/250ML-% IV SOLN
1500.00 | INTRAVENOUS | Status: DC
Start: ? — End: 2019-06-26

## 2019-06-26 MED ORDER — SENNOSIDES-DOCUSATE SODIUM 8.6-50 MG PO TABS
2.00 | ORAL_TABLET | ORAL | Status: DC
Start: 2019-06-27 — End: 2019-06-26

## 2019-06-26 MED ORDER — PROPOFOL 100 MG/10ML IV EMUL
5.00 | INTRAVENOUS | Status: DC
Start: ? — End: 2019-06-26

## 2019-06-26 MED ORDER — GENERIC EXTERNAL MEDICATION
Status: DC
Start: ? — End: 2019-06-26

## 2019-06-26 MED ORDER — BACITRACIN-NEOMYCIN-POLYMYXIN 400-5-5000 EX OINT
TOPICAL_OINTMENT | CUTANEOUS | Status: DC
Start: ? — End: 2019-06-26

## 2019-06-26 MED ORDER — GENERIC EXTERNAL MEDICATION
3.38 | Status: DC
Start: 2019-06-27 — End: 2019-06-26

## 2019-06-26 MED ORDER — SORBITOL 70 % PO SOLN
30.00 | ORAL | Status: DC
Start: ? — End: 2019-06-26

## 2019-06-26 MED ORDER — ACETAMINOPHEN 325 MG PO TABS
975.00 | ORAL_TABLET | ORAL | Status: DC
Start: ? — End: 2019-06-26

## 2019-06-26 MED ORDER — NOREPINEPHRINE-SODIUM CHLORIDE 16-0.9 MG/250ML-% IV SOLN
0.01 | INTRAVENOUS | Status: DC
Start: ? — End: 2019-06-26

## 2019-06-26 MED ORDER — ALBUTEROL SULFATE 2.5 MG/0.5ML IN NEBU
2.50 | INHALATION_SOLUTION | RESPIRATORY_TRACT | Status: DC
Start: ? — End: 2019-06-26

## 2019-06-26 MED ORDER — CHLORHEXIDINE GLUCONATE 0.12 % MT SOLN
5.00 | OROMUCOSAL | Status: DC
Start: 2019-06-26 — End: 2019-06-26

## 2019-06-26 MED ORDER — FAMOTIDINE 20 MG PO TABS
20.00 | ORAL_TABLET | ORAL | Status: DC
Start: 2019-06-27 — End: 2019-06-26

## 2019-06-26 MED ORDER — POLYETHYLENE GLYCOL 3350 17 G PO PACK
17.00 | PACK | ORAL | Status: DC
Start: 2019-06-27 — End: 2019-06-26

## 2019-06-26 MED ORDER — DEXMEDETOMIDINE HCL IN NACL 400 MCG/100ML IV SOLN
0.20 | INTRAVENOUS | Status: DC
Start: ? — End: 2019-06-26

## 2019-06-26 MED ORDER — GENERIC EXTERNAL MEDICATION
1.00 | Status: DC
Start: 2019-06-26 — End: 2019-06-26

## 2019-08-15 DEATH — deceased

## 2021-02-10 IMAGING — CR DG CHEST 2V
2 series · 2 of 2 positions shown · non-contrast
Comparison: Radiograph 11/16/2015, CT 04/05/2012

CLINICAL DATA: Fever, shortness of breath, headache for 3 days

EXAM:
CHEST - 2 VIEW

[chest lat]
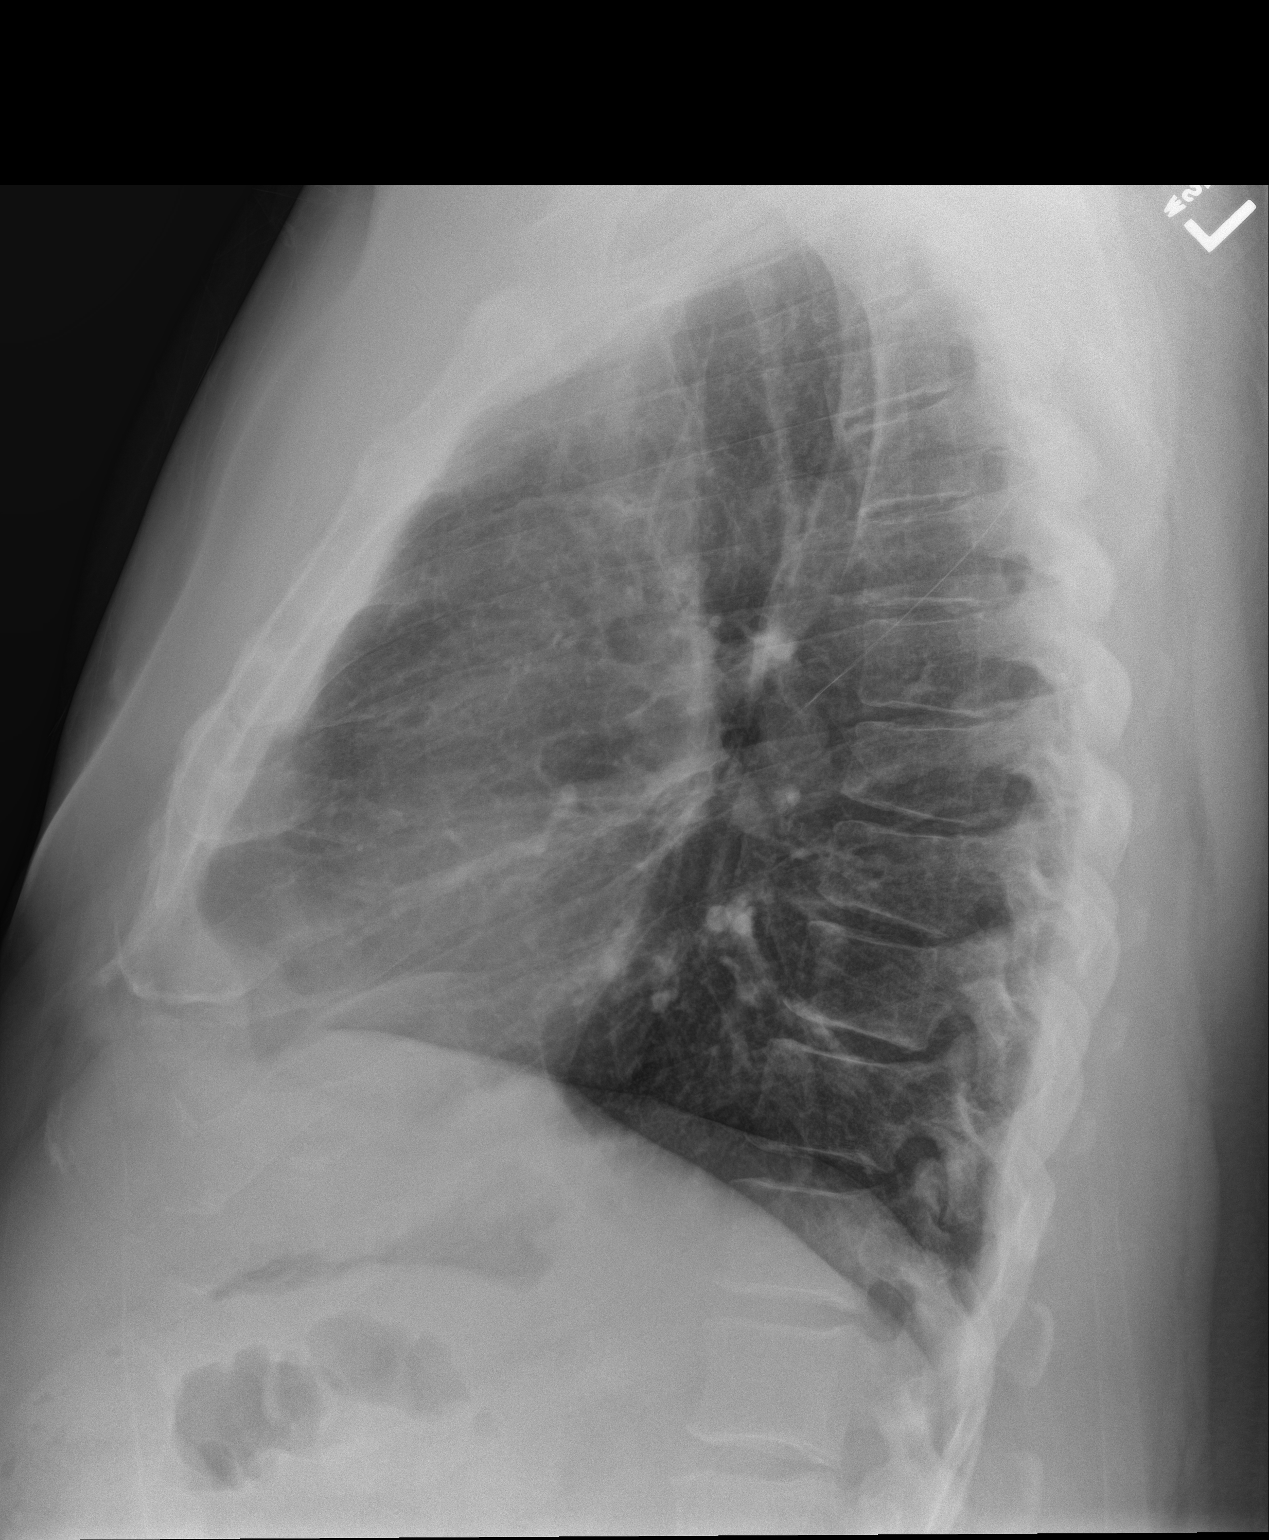

[chest pa]
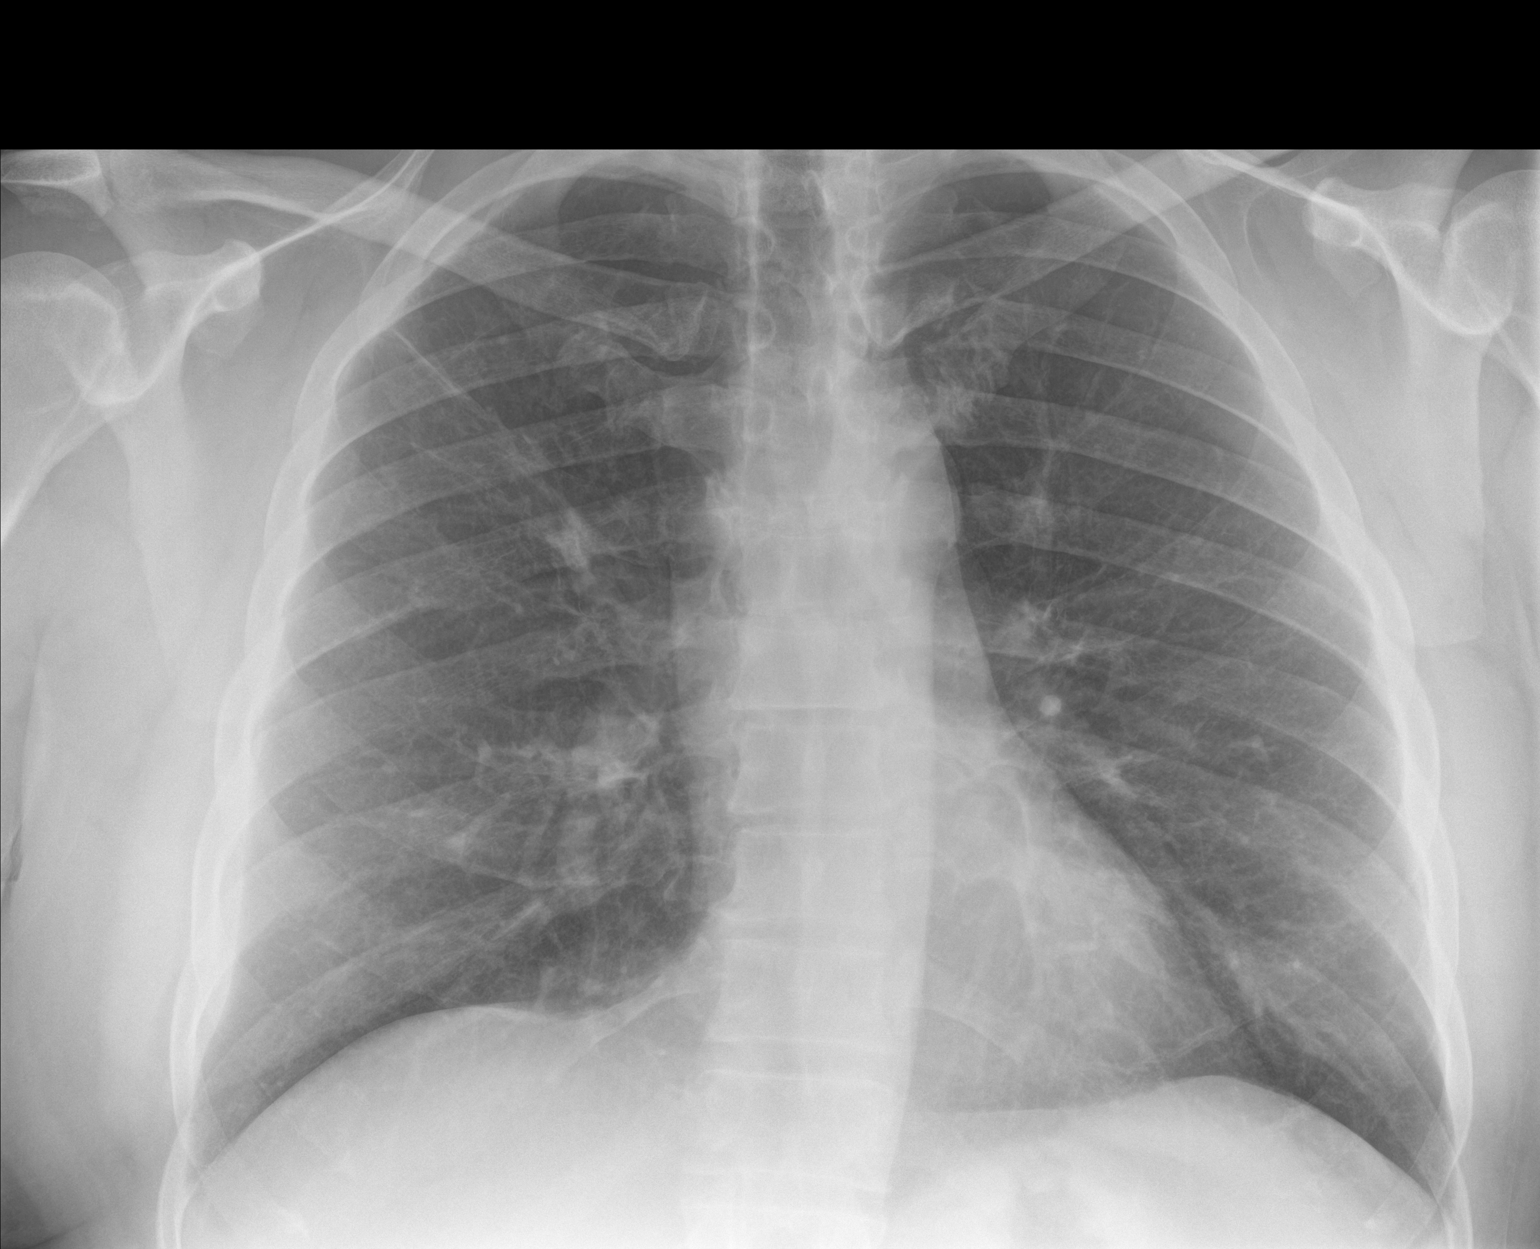

[2 of 2 positions shown; findings below may reference images not displayed]

FINDINGS: Stable bandlike area of scarring in the right upper lung. No
consolidation, features of edema, pneumothorax, or effusion.
Pulmonary vascularity is normally distributed. The cardiomediastinal
contours are unremarkable. No acute osseous or soft tissue
abnormality.
IMPRESSION: Stable right upper lobe scarring. No acute cardiopulmonary
abnormality.
